# Patient Record
Sex: Male | Born: 1985
Health system: Southern US, Community
[De-identification: ages and names within clinical notes are randomized; demographics above are authoritative.]

## PROBLEM LIST (undated history)

## (undated) DIAGNOSIS — J45909 Unspecified asthma, uncomplicated: Secondary | ICD-10-CM

## (undated) DIAGNOSIS — E785 Hyperlipidemia, unspecified: Secondary | ICD-10-CM

## (undated) DIAGNOSIS — E079 Disorder of thyroid, unspecified: Secondary | ICD-10-CM

## (undated) HISTORY — DX: Unspecified asthma, uncomplicated: J45.909

## (undated) HISTORY — PX: NO PAST SURGERIES: SHX2092

## (undated) HISTORY — DX: Hyperlipidemia, unspecified: E78.5

---

## 2010-07-11 ENCOUNTER — Ambulatory Visit: Payer: Self-pay | Admitting: Internal Medicine

## 2010-07-18 ENCOUNTER — Other Ambulatory Visit: Payer: Self-pay | Admitting: Surgery

## 2010-07-20 ENCOUNTER — Ambulatory Visit: Payer: Self-pay | Admitting: Surgery

## 2010-12-27 ENCOUNTER — Ambulatory Visit: Payer: Self-pay

## 2012-12-11 ENCOUNTER — Ambulatory Visit: Payer: Self-pay | Admitting: Physician Assistant

## 2012-12-11 LAB — BASIC METABOLIC PANEL
Anion Gap: 7 (ref 7–16)
Calcium, Total: 9.2 mg/dL (ref 8.5–10.1)
Chloride: 100 mmol/L (ref 98–107)
Co2: 31 mmol/L (ref 21–32)
Creatinine: 1.09 mg/dL (ref 0.60–1.30)
EGFR (African American): >60
EGFR (Non-African Amer.): >60
Glucose: 97 mg/dL (ref 65–99)
Potassium: 3.9 mmol/L (ref 3.5–5.1)

## 2013-07-03 ENCOUNTER — Ambulatory Visit: Payer: Self-pay | Admitting: Emergency Medicine

## 2013-10-01 ENCOUNTER — Other Ambulatory Visit: Payer: Self-pay | Admitting: Family

## 2013-10-01 DIAGNOSIS — R1032 Left lower quadrant pain: Secondary | ICD-10-CM

## 2013-10-02 ENCOUNTER — Ambulatory Visit
Admission: RE | Admit: 2013-10-02 | Discharge: 2013-10-02 | Disposition: A | Discharge status: Home / Self Care | Payer: 59 | Source: Ambulatory Visit | Attending: Family | Admitting: Family

## 2013-10-02 DIAGNOSIS — R1032 Left lower quadrant pain: Secondary | ICD-10-CM

## 2013-10-02 MED ORDER — IOHEXOL 300 MG/ML  SOLN
100.0000 mL | Freq: Once | INTRAMUSCULAR | Status: AC | PRN
  Administered 2013-10-02: 100 mL via INTRAVENOUS

## 2014-08-13 ENCOUNTER — Other Ambulatory Visit: Payer: Self-pay | Admitting: Nurse Practitioner

## 2014-08-13 ENCOUNTER — Other Ambulatory Visit: Payer: Self-pay | Admitting: Internal Medicine

## 2014-08-13 ENCOUNTER — Ambulatory Visit
Admission: RE | Admit: 2014-08-13 | Discharge: 2014-08-13 | Disposition: A | Discharge status: Home / Self Care | Payer: 59 | Source: Ambulatory Visit | Attending: Internal Medicine | Admitting: Internal Medicine

## 2014-08-13 DIAGNOSIS — R059 Cough, unspecified: Secondary | ICD-10-CM

## 2014-08-13 DIAGNOSIS — R509 Fever, unspecified: Secondary | ICD-10-CM

## 2014-08-13 DIAGNOSIS — R05 Cough: Secondary | ICD-10-CM

## 2014-12-09 ENCOUNTER — Ambulatory Visit: Payer: Self-pay | Admitting: Allergy and Immunology

## 2015-01-25 ENCOUNTER — Encounter: Payer: Self-pay | Admitting: Emergency Medicine

## 2015-01-25 ENCOUNTER — Ambulatory Visit
Admission: EM | Admit: 2015-01-25 | Discharge: 2015-01-25 | Disposition: A | Discharge status: Home / Self Care | Payer: 59 | Attending: Family Medicine | Admitting: Family Medicine

## 2015-01-25 DIAGNOSIS — L259 Unspecified contact dermatitis, unspecified cause: Secondary | ICD-10-CM | POA: Diagnosis not present

## 2015-01-25 MED ORDER — METHYLPREDNISOLONE 4 MG PO TBPK: ORAL_TABLET | ORAL | Status: DC

## 2015-01-25 MED ORDER — TRIAMCINOLONE ACETONIDE 0.1 % EX CREA: 1.0000 "application " | TOPICAL_CREAM | Freq: Two times a day (BID) | CUTANEOUS | Status: DC

## 2015-01-25 NOTE — ED Provider Notes (Signed)
CSN: [id number]     Arrival date & time 01/25/15  1226 History   First MD Initiated Contact with Patient 01/25/15 1410     Chief Complaint  Patient presents with  . Rash   (Consider location/radiation/quality/duration/timing/severity/associated sxs/prior Treatment) HPI   29 year old male presents with a one-week history of an itchy rash on the extensor surfaces of his arms. He said that occurred after they move some furniture from a warehouse storage area. The fracture was his home. His wife does not have the same symptoms. He does have a extensive history of a previous environmental and food allergies. He has never had this kind of a rash in the past however he states his wife had to buy recently changed detergents but after he started developing the symptoms she had switched back to their original. Despite all this he has been using Benadryl cream but without much success. He states he is trying to avoid itching the area but it is extremely bothersome to him.    History reviewed. No pertinent past medical history. History reviewed. No pertinent past surgical history. History reviewed. No pertinent family history. Social History  Substance Use Topics  . Smoking status: Never Smoker   . Smokeless tobacco: None  . Alcohol Use: Yes    Review of Systems  Constitutional: Negative for fever, chills, diaphoresis and fatigue.  Skin: Positive for rash.  All other systems reviewed and are negative.   Allergies  Review of patient's allergies indicates no known allergies.  Home Medications   Prior to Admission medications   Medication Sig Start Date End Date Taking? Authorizing Provider  cetirizine (ZYRTEC) 10 MG tablet Take 10 mg by mouth daily.   Yes Historical Provider, MD  methylPREDNISolone (MEDROL DOSEPAK) 4 MG TBPK tablet Take per package instructions 01/25/15   Lutricia FeilWilliam P Belissa Kooy, PA-C  triamcinolone cream (KENALOG) 0.1 % Apply 1 application topically 2 (two) times daily. 01/25/15    Lutricia FeilWilliam P Tysean Vand[place], PA-C   Meds Ordered and Administered this Visit  Medications - No data to display  BP 127/87 mmHg  Pulse 82  Temp(Src) 96.4 F (35.8 C) (Tympanic)  Resp 16  Ht 6' (1.829 m)  Wt 187 lb (84.823 kg)  BMI 25.36 kg/m2  SpO2 98% No data found.   Physical Exam  Constitutional: He is oriented to person, place, and time. He appears well-developed and well-nourished.  HENT:  Head: Normocephalic and atraumatic.  Eyes: Pupils are equal, round, and reactive to light.  Neck: Neck supple.  Musculoskeletal: Normal range of motion. He exhibits no edema or tenderness.  Neurological: He is alert and oriented to person, place, and time.  Skin: Rash noted.  Examination of his upper torso shows extensor surface erythema splotches of erythematous macular papular with a few vesicles present extending into the axilla but not involving his hands below the wrists. Is no flexor surface involvement. He has no other involvement of his torso neck face legs groin or feet. No excoriations are present.  Psychiatric: He has a normal mood and affect. His behavior is normal. Judgment and thought content normal.  Nursing note and vitals reviewed.   ED Course  Procedures (including critical care time)  Labs Review Labs Reviewed - No data to display  Imaging Review No results found.   Visual Acuity Review  Right Eye Distance:   Left Eye Distance:   Bilateral Distance:    Right Eye Near:   Left Eye Near:    Bilateral Near:  MDM   1. Contact dermatitis and eczema    Discharge Medication List as of 01/25/2015  2:37 PM    START taking these medications   Details  methylPREDNISolone (MEDROL DOSEPAK) 4 MG TBPK tablet Take per package instructions, Normal    triamcinolone cream (KENALOG) 0.1 % Apply 1 application topically 2 (two) times daily., Starting 01/25/2015, Until Discontinued, Normal      Plan: 1Ddiagnosis reviewed with patient 2. rx as per orders; risks,  benefits, potential side effects reviewed with patient 3. Recommend supportive treatment with Use emollients after showering.   4. F/u  With derm if not improving     Lutricia Feil, PA-C 01/26/15 1710

## 2015-01-25 NOTE — Discharge Instructions (Signed)

## 2015-01-25 NOTE — ED Notes (Signed)
Patient c/o itchy rash on his arms for a week.

## 2015-05-24 ENCOUNTER — Ambulatory Visit
Admission: EM | Admit: 2015-05-24 | Discharge: 2015-05-24 | Disposition: A | Discharge status: Home / Self Care | Payer: 59 | Attending: Emergency Medicine | Admitting: Emergency Medicine

## 2015-05-24 DIAGNOSIS — J101 Influenza due to other identified influenza virus with other respiratory manifestations: Secondary | ICD-10-CM

## 2015-05-24 LAB — RAPID INFLUENZA A&B ANTIGENS
Influenza A (ARMC): NEGATIVE
Influenza B (ARMC): POSITIVE — AB

## 2015-05-24 MED ORDER — VALACYCLOVIR HCL 1 G PO TABS: 2000.0000 mg | ORAL_TABLET | Freq: Two times a day (BID) | ORAL | Status: DC

## 2015-05-24 MED ORDER — IBUPROFEN 800 MG PO TABS: 800.0000 mg | ORAL_TABLET | Freq: Three times a day (TID) | ORAL | Status: DC

## 2015-05-24 MED ORDER — OSELTAMIVIR PHOSPHATE 75 MG PO CAPS: 75.0000 mg | ORAL_CAPSULE | Freq: Two times a day (BID) | ORAL | Status: DC

## 2015-05-24 NOTE — Discharge Instructions (Signed)
Do not fill the Valtrex until you get the results of your culture. This will be available in several days. You can call here to get your results. Start ibuprofen with 1 g of Tylenol 3 times a day. Drink plenty of extra fluids. Use the cough syrup as needed for coughing. Make sure you finish the Tamiflu.

## 2015-05-24 NOTE — ED Provider Notes (Signed)
HPI  SUBJECTIVE:  Laurelyn SickleKrishna P Hyder is a 30 y.o. male who presents with fevers Tmax 100.2, headaches, body aches, weakness, chills, nasal congestion, rhinorrhea, sore throat, postnasal drip, cough for the past 2 days. He reports mild photophobia. No wheezing, chest pain, shortness of breath. He is able to sleep at night. No ear pain, sinus pain or pressure. No nausea, vomiting, abdominal pain. No urinary complaints. No neck stiffness, rash. He has been taking TheraFlu and ibuprofen, Hycodan cough syrup with some improvement. No aggravating factors. He also notes some blisters starting on his left lower lip today. He denies any burning or paresthesias. He has past medical history hypercholesterolemia, no history of asthma, eczema, COPD. He is a smoker. No history of diabetes, hypertension, cold sores, herpes. PMD: Dr. [place]lyne [place]Sanders in [place], is looking for primary care physician here.   History reviewed. No pertinent past medical history.  Past Surgical History  Procedure Laterality Date  . No past surgeries      History reviewed. No pertinent family history.  Social History  Substance Use Topics  . Smoking status: Never Smoker   . Smokeless tobacco: None  . Alcohol Use: No    No current facility-administered medications for this encounter.  Current outpatient prescriptions:  .  ibuprofen (ADVIL,MOTRIN) 800 MG tablet, Take 1 tablet (800 mg total) by mouth 3 (three) times daily., Disp: 30 tablet, Rfl: 0 .  oseltamivir (TAMIFLU) 75 MG capsule, Take 1 capsule (75 mg total) by mouth 2 (two) times daily. X 5 days, Disp: 10 capsule, Rfl: 0 .  valACYclovir (VALTREX) 1000 MG tablet, Take 2 tablets (2,000 mg total) by mouth 2 (two) times daily. X 1 day., Disp: 20 tablet, Rfl: 0 .  [DISCONTINUED] cetirizine (ZYRTEC) 10 MG tablet, Take 10 mg by mouth daily., Disp: , Rfl:   No Known Allergies   ROS  As noted in HPI.   Physical Exam  BP 121/79 mmHg  Pulse 101  Temp(Src) 100.5 F (38.1  C) (Tympanic)  Resp 16  Ht 6' (1.829 m)  Wt 181 lb (82.101 kg)  BMI 24.54 kg/m2  SpO2 99%  Constitutional: Well developed, well nourished, no acute distress Eyes: PERRL, EOMI, conjunctiva normal bilaterally HENT: Normocephalic, atraumatic,mucus membranes moist. TMs normal bilaterally. Positive erythematous swollen turbinates with nasal congestion. No sinus tenderness. Slightly erythematous oropharynx, uvula midline, tonsils normal. + grouped vesicles on anterior lower lip. Skin intact. No other oral ulcers. Neck: Positive cervical lymphadenopathy. No meningismus.  respiratory:  Clear to auscultation bilaterally, no rales, no wheezing, no rhonchi Cardiovascular: Regular tachycardia, no murmurs, no gallops, no rubs GI: Soft, nondistended, normal bowel sounds, nontender, no rebound, no guarding Back: no CVAT skin: No rash, skin intact Musculoskeletal: No edema, no tenderness, no deformities Neurologic: Alert & oriented x 3, CN II-XII grossly intact, no motor deficits, sensation grossly intact Psychiatric: Speech and behavior appropriate   ED Course   Medications - No data to display  Orders Placed This Encounter  Procedures  . Rapid Influenza A&B Antigens ([facility] only)    Standing Status: Standing     Number of Occurrences: 1     Standing Expiration Date:   . Hsv Culture And Typing    Standing Status: Standing     Number of Occurrences: 1     Standing Expiration Date:   . Droplet precaution    Standing Status: Standing     Number of Occurrences: 1     Standing Expiration Date:    Results for orders placed  or performed during the hospital encounter of 05/24/15 (from the past 24 hour(s))  Rapid Influenza A&B Antigens ([facility] only)     Status: Abnormal   Collection Time: 05/24/15  8:54 PM  Result Value Ref Range   Influenza A ([facility]) NEGATIVE NEGATIVE   Influenza B ([facility]) POSITIVE (A) NEGATIVE   No results found.  ED Clinical Impression  Influenza B   ED  Assessment/Plan  She is influenza B-positive. At home with ibuprofen and Tamiflu. He has plenty of cough syrup at home. Could also be a primary HSV infection, so sent off herpes culture after unroofing a vesicle on his lip. We'll send home with Valtrex for patient to fill if the HSV culture is positive. He is to call here for results in several days. He is to wait and fill it. Will provide primary care referral to [facility] primary care.  Discussed labs, MDM, plan and followup with patient. Discussed sn/sx that should prompt return to the ED. Patient agrees with plan.   *This clinic note was created using Dragon dictation software. Therefore, there may be occasional mistakes despite careful proofreading.  ?   Domenick Gong, MD 05/24/15 2152

## 2015-05-24 NOTE — ED Notes (Signed)
Patient complains of fever, cough, cold chills, loss of appetite and weakness with headaches. Patient states that symptoms started on Friday.

## 2015-05-28 LAB — HSV CULTURE AND TYPING

## 2015-05-29 ENCOUNTER — Telehealth: Payer: Self-pay | Admitting: Emergency Medicine

## 2015-05-29 NOTE — ED Notes (Signed)
Patient notified that his HSV culture came back positive.  Patient states that he has not started his Valacyclovir.  Patient was instructed to go ahead and start his Valacyclovir as directed and to follow-up with his PCP if his symptoms persist or worsen.  Patient verbalized understanding.

## 2015-10-05 ENCOUNTER — Encounter: Payer: Self-pay | Admitting: *Deleted

## 2015-10-05 ENCOUNTER — Ambulatory Visit
Admission: EM | Admit: 2015-10-05 | Discharge: 2015-10-05 | Disposition: A | Discharge status: Home / Self Care | Payer: 59 | Attending: Family Medicine | Admitting: Family Medicine

## 2015-10-05 DIAGNOSIS — B349 Viral infection, unspecified: Secondary | ICD-10-CM

## 2015-10-05 LAB — URINALYSIS COMPLETE WITH MICROSCOPIC (ARMC ONLY)
Bacteria, UA: NONE SEEN
Bilirubin Urine: NEGATIVE
Glucose, UA: NEGATIVE mg/dL
Hgb urine dipstick: NEGATIVE
Ketones, ur: NEGATIVE mg/dL
Leukocytes, UA: NEGATIVE
Nitrite: NEGATIVE
Protein, ur: NEGATIVE mg/dL
Specific Gravity, Urine: 1.01 (ref 1.005–1.030)
Squamous Epithelial / LPF: NONE SEEN
pH: 5.5 (ref 5.0–8.0)

## 2015-10-05 LAB — RAPID STREP SCREEN (MED CTR MEBANE ONLY): Streptococcus, Group A Screen (Direct): NEGATIVE

## 2015-10-05 MED ORDER — ACETAMINOPHEN 500 MG PO TABS
1000.0000 mg | ORAL_TABLET | Freq: Once | ORAL | Status: AC
  Administered 2015-10-05: 1000 mg via ORAL

## 2015-10-05 NOTE — ED Triage Notes (Signed)
While at work today pt became dizzy and a headache. Went to see plant nurse and sent home with fever. Here tonight with body aches, fever, sore throat, headache, and dizziness.

## 2015-10-05 NOTE — ED Provider Notes (Signed)
CSN: [id number]     Arrival date & time 10/05/15  1921 History   First MD Initiated Contact with Patient 10/05/15 2018     Chief Complaint  Patient presents with  . Dizziness  . Fever  . Generalized Body Aches  . Headache   (Consider location/radiation/quality/duration/timing/severity/associated sxs/prior Treatment) HPI  This a 30 year old male who presents with a sudden onset of dizziness headache and bodyaches fever and sore throat. He states this started this morning at work and the work nurse took his vital signs with a high fever and high pulse rate. He went home and took a Tamiflu thinking he may have had the flu. He states that after taking one Tamiflu he did not feel any better so decided to come here. His wife just returned from a four-day trip to [facility] he states that he's been going in and out of hot and cold buildings. Temperature presently is 102.4 pulse of 126 blood pressure 122/71 respirations 16 O2 sat on room air 100%. He denies any nausea vomiting has had no stomach pain. He's had no urinary tract symptoms other than it being very dark yellow. He denies any cough or sputum production.   History reviewed. No pertinent past medical history. Past Surgical History:  Procedure Laterality Date  . NO PAST SURGERIES     History reviewed. No pertinent family history. Social History  Substance Use Topics  . Smoking status: Never Smoker  . Smokeless tobacco: Never Used  . Alcohol use 0.0 oz/week    Review of Systems  Constitutional: Positive for activity change, appetite change, chills, fatigue and fever.  HENT: Positive for congestion and sore throat.   Respiratory: Negative for cough, choking and shortness of breath.   All other systems reviewed and are negative.   Allergies  Review of patient's allergies indicates no known allergies.  Home Medications   Prior to Admission medications   Medication Sig Start Date End Date Taking? Authorizing Provider   ibuprofen (ADVIL,MOTRIN) 800 MG tablet Take 1 tablet (800 mg total) by mouth 3 (three) times daily. 05/24/15   Domenick Gong, MD  oseltamivir (TAMIFLU) 75 MG capsule Take 1 capsule (75 mg total) by mouth 2 (two) times daily. X 5 days 05/24/15   Domenick Gong, MD  valACYclovir (VALTREX) 1000 MG tablet Take 2 tablets (2,000 mg total) by mouth 2 (two) times daily. X 1 day. 05/24/15   Domenick Gong, MD   Meds Ordered and Administered this Visit   Medications  acetaminophen (TYLENOL) tablet 1,000 mg (1,000 mg Oral Given 10/05/15 1958)    BP 122/71 (BP Location: Left Arm)   Pulse (!) 126   Temp (!) 102.4 F (39.1 C) (Oral)   Resp 16   Ht 6' (1.829 m)   Wt 180 lb (81.6 kg)   SpO2 100%   BMI 24.41 kg/m  No data found.   Physical Exam  Constitutional: He is oriented to person, place, and time. He appears well-developed and well-nourished. No distress.  HENT:  Head: Normocephalic and atraumatic.  Right Ear: External ear normal.  Left Ear: External ear normal.  Nose: Nose normal.  Mouth/Throat: Oropharynx is clear and moist. No oropharyngeal exudate.  Eyes: EOM are normal. Pupils are equal, round, and reactive to light. Right eye exhibits no discharge. Left eye exhibits no discharge.  Neck: Normal range of motion. Neck supple.  Pulmonary/Chest: Effort normal and breath sounds normal. No respiratory distress. He has no wheezes. He has no rales.  Abdominal: Soft. Bowel  sounds are normal. He exhibits no distension. There is no tenderness. There is no rebound and no guarding.  Musculoskeletal: Normal range of motion. He exhibits no edema, tenderness or deformity.  Lymphadenopathy:    He has no cervical adenopathy.  Neurological: He is alert and oriented to person, place, and time.  Skin: Skin is warm and dry. He is not diaphoretic.  Psychiatric: He has a normal mood and affect. His behavior is normal. Judgment and thought content normal.  Nursing note and vitals reviewed.   Urgent  Care Course   Clinical Course    Procedures (including critical care time)  Labs Review Labs Reviewed  RAPID STREP SCREEN (NOT AT [facility])  CULTURE, GROUP A STREP ([facility])  URINALYSIS COMPLETEWITH MICROSCOPIC ([facility] ONLY)    Imaging Review No results found.   Visual Acuity Review  Right Eye Distance:   Left Eye Distance:   Bilateral Distance:    Right Eye Near:   Left Eye Near:    Bilateral Near:         MDM   1. Viral illness    Discharge Medication List as of 10/05/2015  9:00 PM    Plan: 1. Test/x-ray results and diagnosis reviewed with patient 2. rx as per orders; risks, benefits, potential side effects reviewed with patient 3. Recommend supportive treatment with rest and fever control with Tylenol or Motrin. He needs to keep his fluids intake adequate to keep his urine clear. He has no identifiable source today but may develop over the next couple of days. Should follow-up with his PCP if he is not improving. At the present time we'll be treating him symptomatically and if it's a viral illness will have to run its course. The CNS of the throat swab will be available in 48 hours  4. F/u prn if symptoms worsen or don't improve     Lutricia FeilWilliam P Roemer, PA-C 10/05/15 2107

## 2015-10-08 LAB — CULTURE, GROUP A STREP (THRC)

## 2016-03-01 ENCOUNTER — Ambulatory Visit (INDEPENDENT_AMBULATORY_CARE_PROVIDER_SITE_OTHER): Payer: Self-pay | Admitting: Internal Medicine

## 2016-03-01 ENCOUNTER — Encounter: Payer: Self-pay | Admitting: Internal Medicine

## 2016-03-01 VITALS — BP 120/80 | HR 86 | Temp 98.0°F | Ht 72.0 in | Wt 187.0 lb

## 2016-03-01 DIAGNOSIS — K529 Noninfective gastroenteritis and colitis, unspecified: Secondary | ICD-10-CM

## 2016-03-01 DIAGNOSIS — E782 Mixed hyperlipidemia: Secondary | ICD-10-CM | POA: Insufficient documentation

## 2016-03-01 NOTE — Progress Notes (Addendum)
    Date:  03/01/2016   Name:  Harold Hill   DOB:  03/23/1985   MRN:  [id number]   Chief Complaint: Establish Care Abdominal Cramping  This is a new problem. The current episode started in the past 7 days. The problem occurs 2 to 4 times per day. The problem has been gradually improving. The pain is located in the LLQ. The pain is mild. The quality of the pain is colicky and cramping. Pertinent negatives include no arthralgias, diarrhea, fever, headaches, nausea or vomiting. The pain is relieved by liquids.  Hyperlipidemia  This is a chronic problem. Condition status: mild per patient report. Pertinent negatives include no chest pain or shortness of breath. Current antihyperlipidemic treatment includes diet change and exercise (took medication in the past but stopped after starting exercise and improving diet).  Today he was able to eat breakfast and feels much better.  He has no risk for CAD.    Review of Systems  Constitutional: Positive for fatigue. Negative for chills, fever and unexpected weight change.  Eyes: Negative for visual disturbance.  Respiratory: Negative for cough, chest tightness, shortness of breath and wheezing.   Cardiovascular: Negative for chest pain, palpitations and leg swelling.  Gastrointestinal: Positive for abdominal pain (LLQ mildly tender). Negative for diarrhea, nausea and vomiting.  Musculoskeletal: Negative for arthralgias.  Neurological: Negative for dizziness and headaches.  Psychiatric/Behavioral: Negative for sleep disturbance. The patient is not nervous/anxious.     There are no active problems to display for this patient.   Prior to Admission medications   Not on File    No Known Allergies  Past Surgical History:  Procedure Laterality Date  . NO PAST SURGERIES      Social History  Substance Use Topics  . Smoking status: Never Smoker  . Smokeless tobacco: Never Used  . Alcohol use 0.0 oz/week     Medication list has been reviewed  and updated.   Physical Exam  Constitutional: He is oriented to person, place, and time. He appears well-developed. No distress.  HENT:  Head: Normocephalic and atraumatic.  Neck: Normal range of motion. Neck supple. No thyromegaly present.  Cardiovascular: Normal rate and normal heart sounds.   Pulmonary/Chest: Effort normal and breath sounds normal. No respiratory distress. He has no wheezes.  Abdominal: Soft. Bowel sounds are normal. He exhibits no distension. There is tenderness.  Musculoskeletal: Normal range of motion. He exhibits tenderness (right ankle from old fracture). He exhibits no edema.  Neurological: He is alert and oriented to person, place, and time.  Skin: Skin is warm and dry. No rash noted.  Psychiatric: He has a normal mood and affect. His behavior is normal. Thought content normal. Cognition and memory are normal.  Nursing note and vitals reviewed.   BP 120/80   Pulse 86   Temp 98 F (36.7 C)   Ht 6' (1.829 m)   Wt 187 lb (84.8 kg)   SpO2 97%   BMI 25.36 kg/m   Assessment and Plan: 1. Mixed hyperlipidemia Continue diet and exercise Return in 6 months for CPX and labs  2. Gastroenteritis Resolving - advance diet as tolerated   Bari EdwardLaura Blaike Vickers, MD [facility]  03/01/2016

## 2016-03-20 ENCOUNTER — Telehealth: Payer: Self-pay

## 2016-03-20 NOTE — Telephone Encounter (Signed)
Advised needs to be seen before we can send in any inhalers. Will schedule when returns to town.

## 2016-09-08 ENCOUNTER — Encounter: Payer: Self-pay | Admitting: Internal Medicine

## 2016-09-08 ENCOUNTER — Ambulatory Visit (INDEPENDENT_AMBULATORY_CARE_PROVIDER_SITE_OTHER): Payer: 59 | Admitting: Internal Medicine

## 2016-09-08 VITALS — BP 116/76 | HR 88 | Ht 72.0 in | Wt 195.6 lb

## 2016-09-08 DIAGNOSIS — Z0000 Encounter for general adult medical examination without abnormal findings: Secondary | ICD-10-CM

## 2016-09-08 DIAGNOSIS — R5383 Other fatigue: Secondary | ICD-10-CM | POA: Diagnosis not present

## 2016-09-08 DIAGNOSIS — Z9109 Other allergy status, other than to drugs and biological substances: Secondary | ICD-10-CM

## 2016-09-08 DIAGNOSIS — E782 Mixed hyperlipidemia: Secondary | ICD-10-CM

## 2016-09-08 LAB — POCT URINALYSIS DIPSTICK
Bilirubin, UA: NEGATIVE
Blood, UA: NEGATIVE
GLUCOSE UA: NEGATIVE
KETONES UA: NEGATIVE
LEUKOCYTES UA: NEGATIVE
Nitrite, UA: NEGATIVE
PROTEIN UA: NEGATIVE
Spec Grav, UA: 1.025 (ref 1.010–1.025)
Urobilinogen, UA: 0.2 E.U./dL
pH, UA: 5 (ref 5.0–8.0)

## 2016-09-08 MED ORDER — ALBUTEROL SULFATE HFA 108 (90 BASE) MCG/ACT IN AERS: 2.0000 | INHALATION_SPRAY | Freq: Four times a day (QID) | RESPIRATORY_TRACT | 2 refills | Status: DC | PRN

## 2016-09-08 NOTE — Progress Notes (Signed)
Date:  09/08/2016   Name:  Taber Sweetser [facility]   DOB:  January 09, 1986   MRN:  [id number]   Chief Complaint: Annual Exam Adyn Serna Denz is a 31 y.o. male who presents today for his Complete Annual Exam. He feels fairly well. He reports exercising rarely. He reports he is sleeping fairly well. He has been more fatigued lately - works hard at work on his feet all day but very inactive once he gets home.  Hyperlipidemia  Pertinent negatives include no chest pain or shortness of breath.   Allergies - perennial with mainly chest tightness.  He has some nasal sx as well.  Has a proair inhaler to use as needed but needs refill.   Review of Systems  Constitutional: Positive for fatigue. Negative for chills, fever and unexpected weight change.  HENT: Negative for congestion and trouble swallowing.   Eyes: Negative for visual disturbance.  Respiratory: Negative for chest tightness, shortness of breath and wheezing.   Cardiovascular: Negative for chest pain, palpitations and leg swelling.  Gastrointestinal: Negative for abdominal pain, constipation and diarrhea.  Endocrine: Negative for polydipsia.  Genitourinary: Negative for difficulty urinating, frequency and hematuria.  Musculoskeletal: Negative for arthralgias, back pain and gait problem.  Allergic/Immunologic: Positive for environmental allergies.  Neurological: Negative for dizziness and headaches.  Hematological: Negative for adenopathy.  Psychiatric/Behavioral: Negative for dysphoric mood and sleep disturbance.    Patient Active Problem List   Diagnosis Date Noted  . Mixed hyperlipidemia 03/01/2016    Prior to Admission medications   Medication Sig Start Date End Date Taking? Authorizing Provider  cetirizine (ZYRTEC) 10 MG tablet Take 10 mg by mouth as needed for allergies.   Yes [provider]    No Known Allergies  Past Surgical History:  Procedure Laterality Date  . NO PAST SURGERIES      Social History    Substance Use Topics  . Smoking status: Never Smoker  . Smokeless tobacco: Never Used  . Alcohol use 0.0 oz/week   Depression screen [facility] 2/9 03/01/2016  Decreased Interest 0  Down, Depressed, Hopeless 0  PHQ - 2 Score 0     Medication list has been reviewed and updated.   Physical Exam  Constitutional: He is oriented to person, place, and time. He appears well-developed and well-nourished.  HENT:  Head: Normocephalic.  Right Ear: Tympanic membrane, external ear and ear canal normal.  Left Ear: Tympanic membrane, external ear and ear canal normal.  Nose: Nose normal.  Mouth/Throat: Uvula is midline and oropharynx is clear and moist.  Eyes: Pupils are equal, round, and reactive to light. Conjunctivae and EOM are normal.  Neck: Normal range of motion. Neck supple. Carotid bruit is not present. No thyromegaly present.  Cardiovascular: Normal rate, regular rhythm, normal heart sounds and intact distal pulses.   Pulmonary/Chest: Effort normal and breath sounds normal. He has no wheezes. Right breast exhibits no mass. Left breast exhibits no mass.  Abdominal: Soft. Normal appearance and bowel sounds are normal. There is no hepatosplenomegaly. There is no tenderness.  Musculoskeletal: Normal range of motion. He exhibits no edema or tenderness.  Lymphadenopathy:    He has no cervical adenopathy.  Neurological: He is alert and oriented to person, place, and time. He has normal reflexes.  Skin: Skin is warm, dry and intact.  Psychiatric: He has a normal mood and affect. His speech is normal and behavior is normal. Judgment and thought content normal.  Nursing note and vitals reviewed.  BP 116/76   Pulse 88   Ht 6' (1.829 m)   Wt 195 lb 9.6 oz (88.7 kg)   SpO2 99%   BMI 26.53 kg/m   Assessment and Plan: 1. Annual physical exam Recommend beginning regular exercise   2. Mixed hyperlipidemia Will advise on medication if needed  3. Fatigue, unspecified type Likely due to job  stress and lack of exercise Discussed exercise and adequate sleep  4. Multiple environmental allergies Continue zyrtec and albuterol MDI as needed   Meds ordered this encounter  Medications  . albuterol (PROVENTIL HFA;VENTOLIN HFA) 108 (90 Base) MCG/ACT inhaler    Sig: Inhale 2 puffs into the lungs every 6 (six) hours as needed for wheezing or shortness of breath.    Dispense:  18 g    Refill:  2    Bari EdwardLaura Kaushal Vannice, MD [facility]  09/08/2016

## 2016-09-08 NOTE — Patient Instructions (Signed)

## 2016-09-09 LAB — CBC WITH DIFFERENTIAL/PLATELET
BASOS: 0 %
Basophils Absolute: 0 10*3/uL (ref 0.0–0.2)
EOS (ABSOLUTE): 0.2 10*3/uL (ref 0.0–0.4)
Eos: 3 %
HEMOGLOBIN: 14.6 g/dL (ref 13.0–17.7)
Hematocrit: 43 % (ref 37.5–51.0)
IMMATURE GRANS (ABS): 0 10*3/uL (ref 0.0–0.1)
Immature Granulocytes: 0 %
LYMPHS ABS: 2 10*3/uL (ref 0.7–3.1)
LYMPHS: 35 %
MCH: 26.9 pg (ref 26.6–33.0)
MCHC: 34 g/dL (ref 31.5–35.7)
MCV: 79 fL (ref 79–97)
Monocytes Absolute: 0.3 10*3/uL (ref 0.1–0.9)
Monocytes: 6 %
NEUTROS ABS: 3.2 10*3/uL (ref 1.4–7.0)
Neutrophils: 56 %
Platelets: 235 10*3/uL (ref 150–379)
RBC: 5.43 x10E6/uL (ref 4.14–5.80)
RDW: 15.3 % (ref 12.3–15.4)
WBC: 5.8 10*3/uL (ref 3.4–10.8)

## 2016-09-09 LAB — COMPREHENSIVE METABOLIC PANEL
A/G RATIO: 1.5 (ref 1.2–2.2)
ALBUMIN: 4.6 g/dL (ref 3.5–5.5)
ALK PHOS: 69 IU/L (ref 39–117)
ALT: 25 IU/L (ref 0–44)
AST: 22 IU/L (ref 0–40)
BILIRUBIN TOTAL: 0.5 mg/dL (ref 0.0–1.2)
BUN / CREAT RATIO: 15 (ref 9–20)
BUN: 14 mg/dL (ref 6–20)
CHLORIDE: 100 mmol/L (ref 96–106)
CO2: 23 mmol/L (ref 20–29)
Calcium: 9.5 mg/dL (ref 8.7–10.2)
Creatinine, Ser: 0.96 mg/dL (ref 0.76–1.27)
GFR calc non Af Amer: 104 mL/min/{1.73_m2} (ref >59)
GFR, EST AFRICAN AMERICAN: 120 mL/min/{1.73_m2} (ref >59)
GLOBULIN, TOTAL: 3.1 g/dL (ref 1.5–4.5)
Glucose: 106 mg/dL — ABNORMAL HIGH (ref 65–99)
POTASSIUM: 4.2 mmol/L (ref 3.5–5.2)
SODIUM: 139 mmol/L (ref 134–144)
TOTAL PROTEIN: 7.7 g/dL (ref 6.0–8.5)

## 2016-09-09 LAB — LIPID PANEL
Chol/HDL Ratio: 7 ratio — ABNORMAL HIGH (ref 0.0–5.0)
Cholesterol, Total: 246 mg/dL — ABNORMAL HIGH (ref 100–199)
HDL: 35 mg/dL — ABNORMAL LOW (ref >39)
LDL Calculated: 164 mg/dL — ABNORMAL HIGH (ref 0–99)
Triglycerides: 233 mg/dL — ABNORMAL HIGH (ref 0–149)
VLDL CHOLESTEROL CAL: 47 mg/dL — AB (ref 5–40)

## 2016-09-09 LAB — TSH: TSH: 4.16 u[IU]/mL (ref 0.450–4.500)

## 2016-09-12 ENCOUNTER — Other Ambulatory Visit: Payer: Self-pay | Admitting: Internal Medicine

## 2016-09-12 ENCOUNTER — Telehealth: Payer: Self-pay

## 2016-09-12 DIAGNOSIS — E782 Mixed hyperlipidemia: Secondary | ICD-10-CM

## 2016-09-12 MED ORDER — ATORVASTATIN CALCIUM 10 MG PO TABS: 10.0000 mg | ORAL_TABLET | Freq: Every day | ORAL | 1 refills | Status: DC

## 2016-09-12 NOTE — Telephone Encounter (Signed)
Spoke to pt about cholesterol being high- pt agrees to take medication when sent in. Wants sent to [facility] in [place]. Appt scheduled for 6 months.

## 2017-03-08 ENCOUNTER — Other Ambulatory Visit: Payer: Self-pay | Admitting: Internal Medicine

## 2017-03-15 ENCOUNTER — Ambulatory Visit (INDEPENDENT_AMBULATORY_CARE_PROVIDER_SITE_OTHER): Payer: 59 | Admitting: Internal Medicine

## 2017-03-15 ENCOUNTER — Encounter: Payer: Self-pay | Admitting: Internal Medicine

## 2017-03-15 VITALS — BP 122/84 | HR 86 | Ht 72.0 in | Wt 192.0 lb

## 2017-03-15 DIAGNOSIS — E782 Mixed hyperlipidemia: Secondary | ICD-10-CM | POA: Diagnosis not present

## 2017-03-15 NOTE — Progress Notes (Signed)
Date:  03/15/2017   Name:  Harold Hill   DOB:  12-10-1985   MRN:  [id number]   Chief Complaint: Hyperlipidemia     Hyperlipidemia  This is a chronic problem. Pertinent negatives include no chest pain, myalgias or shortness of breath. Current antihyperlipidemic treatment includes statins (statin started last visit in July).   His father committed suicide several months ago and he has struggled for a while.  He lost some weight but is gaining it back.  Grief symptoms are slowly improving. On a happier note, his wife is [redacted] weeks pregnant after IVF and doing well.  Lab Results  Component Value Date   CHOL 246 (H) 09/08/2016   HDL 35 (L) 09/08/2016   LDLCALC 164 (H) 09/08/2016   TRIG 233 (H) 09/08/2016   CHOLHDL 7.0 (H) 09/08/2016      Review of Systems  Constitutional: Negative for chills, fatigue and unexpected weight change.  Respiratory: Negative for chest tightness and shortness of breath.   Cardiovascular: Negative for chest pain and palpitations.  Gastrointestinal: Negative for abdominal distention, abdominal pain, diarrhea and vomiting.  Musculoskeletal: Negative for arthralgias, joint swelling and myalgias.  Allergic/Immunologic: Positive for environmental allergies.  Neurological: Negative for dizziness and headaches.  Psychiatric/Behavioral: Negative for sleep disturbance.    Patient Active Problem List   Diagnosis Date Noted  . Multiple environmental allergies 09/08/2016  . Mixed hyperlipidemia 03/01/2016    Prior to Admission medications   Medication Sig Start Date End Date Taking? Authorizing Provider  albuterol (PROVENTIL HFA;VENTOLIN HFA) 108 (90 Base) MCG/ACT inhaler Inhale 2 puffs into the lungs every 6 (six) hours as needed for wheezing or shortness of breath. 09/08/16  Yes Reubin Milan, MD  atorvastatin (LIPITOR) 10 MG tablet TAKE 1 TABLET BY MOUTH EVERY DAY 03/08/17  Yes Reubin Milan, MD  cetirizine (ZYRTEC) 10 MG tablet Take 10 mg by  mouth as needed for allergies.   Yes [provider]    No Known Allergies  Past Surgical History:  Procedure Laterality Date  . NO PAST SURGERIES      Social History   Tobacco Use  . Smoking status: Never Smoker  . Smokeless tobacco: Never Used  Substance Use Topics  . Alcohol use: Yes    Alcohol/week: 0.0 oz  . Drug use: No     Medication list has been reviewed and updated.  PHQ 2/9 Scores 03/15/2017 03/01/2016  PHQ - 2 Score 0 0    Physical Exam  Constitutional: He is oriented to person, place, and time. He appears well-developed. No distress.  HENT:  Head: Normocephalic and atraumatic.  Neck: Normal range of motion. Neck supple. Carotid bruit is not present.  Cardiovascular: Normal rate, regular rhythm and normal heart sounds.  Pulmonary/Chest: Effort normal and breath sounds normal. No respiratory distress.  Musculoskeletal: Normal range of motion.  Neurological: He is alert and oriented to person, place, and time.  Skin: Skin is warm and dry. No rash noted.  Psychiatric: He has a normal mood and affect. His speech is normal and behavior is normal. Thought content normal. His mood appears not anxious. He does not exhibit a depressed mood.  Nursing note and vitals reviewed.   BP 122/84   Pulse 86   Ht 6' (1.829 m)   Wt 192 lb (87.1 kg)   SpO2 100%   BMI 26.04 kg/m   Assessment and Plan: 1. Mixed hyperlipidemia Continue statin therapy - will advise of results - Comprehensive metabolic  panel - Lipid panel   No orders of the defined types were placed in this encounter.   Partially dictated using Animal nutritionistDragon software. Any errors are unintentional.  Bari EdwardLaura Emanuelle Hammerstrom, MD [facility]  03/15/2017

## 2017-03-16 LAB — COMPREHENSIVE METABOLIC PANEL
ALT: 30 IU/L (ref 0–44)
AST: 25 IU/L (ref 0–40)
Albumin/Globulin Ratio: 1.6 (ref 1.2–2.2)
Albumin: 5.1 g/dL (ref 3.5–5.5)
Alkaline Phosphatase: 78 IU/L (ref 39–117)
BUN/Creatinine Ratio: 15 (ref 9–20)
BUN: 16 mg/dL (ref 6–20)
Bilirubin Total: 0.6 mg/dL (ref 0.0–1.2)
CALCIUM: 9.8 mg/dL (ref 8.7–10.2)
CHLORIDE: 99 mmol/L (ref 96–106)
CO2: 26 mmol/L (ref 20–29)
Creatinine, Ser: 1.04 mg/dL (ref 0.76–1.27)
GFR calc Af Amer: 109 mL/min/{1.73_m2} (ref >59)
GFR, EST NON AFRICAN AMERICAN: 94 mL/min/{1.73_m2} (ref >59)
GLUCOSE: 94 mg/dL (ref 65–99)
Globulin, Total: 3.2 g/dL (ref 1.5–4.5)
Potassium: 4.6 mmol/L (ref 3.5–5.2)
Sodium: 141 mmol/L (ref 134–144)
TOTAL PROTEIN: 8.3 g/dL (ref 6.0–8.5)

## 2017-03-16 LAB — LIPID PANEL
CHOL/HDL RATIO: 5.5 ratio — AB (ref 0.0–5.0)
Cholesterol, Total: 219 mg/dL — ABNORMAL HIGH (ref 100–199)
HDL: 40 mg/dL (ref >39)
LDL Calculated: 141 mg/dL — ABNORMAL HIGH (ref 0–99)
TRIGLYCERIDES: 188 mg/dL — AB (ref 0–149)
VLDL CHOLESTEROL CAL: 38 mg/dL (ref 5–40)

## 2017-07-19 ENCOUNTER — Other Ambulatory Visit: Payer: Self-pay

## 2017-07-19 ENCOUNTER — Ambulatory Visit
Admission: EM | Admit: 2017-07-19 | Discharge: 2017-07-19 | Disposition: A | Discharge status: Home / Self Care | Payer: 59 | Attending: Family Medicine | Admitting: Family Medicine

## 2017-07-19 ENCOUNTER — Encounter: Payer: Self-pay | Admitting: Emergency Medicine

## 2017-07-19 DIAGNOSIS — R69 Illness, unspecified: Secondary | ICD-10-CM | POA: Diagnosis not present

## 2017-07-19 DIAGNOSIS — J111 Influenza due to unidentified influenza virus with other respiratory manifestations: Secondary | ICD-10-CM

## 2017-07-19 MED ORDER — IBUPROFEN 800 MG PO TABS
800.0000 mg | ORAL_TABLET | Freq: Once | ORAL | Status: AC
  Administered 2017-07-19: 800 mg via ORAL

## 2017-07-19 MED ORDER — IBUPROFEN 800 MG PO TABS: 800.0000 mg | ORAL_TABLET | Freq: Three times a day (TID) | ORAL | 0 refills | Status: DC

## 2017-07-19 MED ORDER — OSELTAMIVIR PHOSPHATE 75 MG PO CAPS: 75.0000 mg | ORAL_CAPSULE | Freq: Two times a day (BID) | ORAL | 0 refills | Status: DC

## 2017-07-19 NOTE — Discharge Instructions (Signed)
Rest, fluids.  Tylenol 1000 mg three times daily as needed.  Motrin as prescribed.  Tamiflu as prescribed.  Take care  Dr. Adriana Simas

## 2017-07-19 NOTE — ED Triage Notes (Signed)
Patient in today c/o not feeling well since last night. Has felt feverish, headache and slight body aches. Temperature at work this afternoon at work was 103. Patient has tried OTC Tylenol with his last dose being at 4:30am.

## 2017-07-19 NOTE — ED Provider Notes (Signed)
[facility]    CSN: [id number] Arrival date & time: 07/19/17  1434  History   Chief Complaint Chief Complaint  Patient presents with  . Fever   HPI   32 year old male presents with fever and body aches.  Started last night.  Started abruptly with high fever, T-max 103.  Associated headache, and body aches.  He is taken over-the-counter Tylenol without improvement.  Patient thinks that he has the flu.  No known exacerbating factors.  No reported sick contacts.  No other associated symptoms.  No other complaints.  Past Medical History:  Diagnosis Date  . Asthma   . Hyperlipidemia    Patient Active Problem List   Diagnosis Date Noted  . Multiple environmental allergies 09/08/2016  . Mixed hyperlipidemia 03/01/2016   Past Surgical History:  Procedure Laterality Date  . NO PAST SURGERIES     Home Medications    Prior to Admission medications   Medication Sig Start Date End Date Taking? Authorizing Provider  atorvastatin (LIPITOR) 10 MG tablet TAKE 1 TABLET BY MOUTH EVERY DAY 03/08/17  Yes Reubin Milan, MD  cetirizine (ZYRTEC) 10 MG tablet Take 10 mg by mouth as needed for allergies.   Yes [provider]  albuterol (PROVENTIL HFA;VENTOLIN HFA) 108 (90 Base) MCG/ACT inhaler Inhale 2 puffs into the lungs every 6 (six) hours as needed for wheezing or shortness of breath. 09/08/16   Reubin Milan, MD  ibuprofen (ADVIL,MOTRIN) 800 MG tablet Take 1 tablet (800 mg total) by mouth 3 (three) times daily. 07/19/17   Tommie Sams, DO  oseltamivir (TAMIFLU) 75 MG capsule Take 1 capsule (75 mg total) by mouth every 12 (twelve) hours. 07/19/17   Tommie Sams, DO   Family History Family History  Problem Relation Age of Onset  . Hypertension Mother   . Hyperlipidemia Mother   . Diabetes Father    Social History Social History   Tobacco Use  . Smoking status: Never Smoker  . Smokeless tobacco: Never Used  Substance Use Topics  . Alcohol use: Yes   Alcohol/week: 0.0 oz    Comment: socially  . Drug use: No   Allergies   Patient has no known allergies.  Review of Systems Review of Systems  Constitutional: Positive for fever.  Musculoskeletal:       Body aches.  Neurological: Positive for headaches.   Physical Exam Triage Vital Signs ED Triage Vitals  Enc Vitals Group     BP 07/19/17 1500 115/69     Pulse Rate 07/19/17 1500 (!) 133     Resp 07/19/17 1500 16     Temp 07/19/17 1500 (!) 100.7 F (38.2 C)     Temp Source 07/19/17 1500 Oral     SpO2 07/19/17 1500 100 %     Weight 07/19/17 1501 197 lb (89.4 kg)     Height 07/19/17 1501 6' (1.829 m)     Head Circumference --      Peak Flow --      Pain Score 07/19/17 1501 5     Pain Loc --      Pain Edu? --      Excl. in GC? --    Updated Vital Signs BP 115/69 (BP Location: Left Arm)   Pulse (!) 133   Temp (!) 100.7 F (38.2 C) (Oral)   Resp 16   Ht 6' (1.829 m)   Wt 197 lb (89.4 kg)   SpO2 100%   BMI 26.72  kg/m   Physical Exam  Constitutional: He is oriented to person, place, and time. He appears well-developed. No distress.  HENT:  Head: Normocephalic and atraumatic.  Mouth/Throat: Oropharynx is clear and moist.  Cardiovascular:  Tachycardic.  Regular rhythm.  Pulmonary/Chest: Effort normal and breath sounds normal. He has no wheezes. He has no rales.  Neurological: He is alert and oriented to person, place, and time.  Psychiatric: He has a normal mood and affect. His behavior is normal.  Nursing note and vitals reviewed.  UC Treatments / Results  Labs (all labs ordered are listed, but only abnormal results are displayed) Labs Reviewed - No data to display  EKG None  Radiology No results found.  Procedures Procedures (including critical care time)  Medications Ordered in UC Medications  ibuprofen (ADVIL,MOTRIN) tablet 800 mg (800 mg Oral Given 07/19/17 1507)    Initial Impression / Assessment and Plan / UC Course  I have reviewed the triage  vital signs and the nursing notes.  Pertinent labs & imaging results that were available during my care of the patient were reviewed by me and considered in my medical decision making (see chart for details).    32 year old male presents with an influenza-like illness.  Treating with Tamiflu, Motrin.  Tylenol as needed.  Supportive care.  Final Clinical Impressions(s) / UC Diagnoses   Final diagnoses:  Influenza-like illness     Discharge Instructions     Rest, fluids.  Tylenol 1000 mg three times daily as needed.  Motrin as prescribed.  Tamiflu as prescribed.  Take care  Dr. Adriana Simas    ED Prescriptions    Medication Sig Dispense Auth. Provider   oseltamivir (TAMIFLU) 75 MG capsule Take 1 capsule (75 mg total) by mouth every 12 (twelve) hours. 10 capsule Mustaf Antonacci G, DO   ibuprofen (ADVIL,MOTRIN) 800 MG tablet Take 1 tablet (800 mg total) by mouth 3 (three) times daily. 30 tablet Tommie Sams, DO     Controlled Substance Prescriptions [place] Controlled Substance Registry consulted? Not Applicable   Tommie Sams, DO 07/19/17 1612

## 2017-09-11 ENCOUNTER — Encounter: Payer: 59 | Admitting: Internal Medicine

## 2018-01-11 ENCOUNTER — Ambulatory Visit (INDEPENDENT_AMBULATORY_CARE_PROVIDER_SITE_OTHER): Payer: 59 | Admitting: Internal Medicine

## 2018-01-11 ENCOUNTER — Encounter: Payer: Self-pay | Admitting: Internal Medicine

## 2018-01-11 VITALS — BP 108/80 | HR 110 | Temp 98.5°F | Ht 72.0 in | Wt 199.0 lb

## 2018-01-11 DIAGNOSIS — M545 Low back pain, unspecified: Secondary | ICD-10-CM

## 2018-01-11 DIAGNOSIS — R509 Fever, unspecified: Secondary | ICD-10-CM

## 2018-01-11 DIAGNOSIS — R3129 Other microscopic hematuria: Secondary | ICD-10-CM | POA: Diagnosis not present

## 2018-01-11 DIAGNOSIS — R6889 Other general symptoms and signs: Secondary | ICD-10-CM

## 2018-01-11 LAB — POC URINALYSIS WITH MICROSCOPIC (NON AUTO)MANUAL RESULT
BACTERIA UA: 0
Bilirubin, UA: NEGATIVE
CRYSTALS: 0
Epithelial cells, urine per micros: 0
Glucose, UA: NEGATIVE
KETONES UA: NEGATIVE
Leukocytes, UA: NEGATIVE
MUCUS UA: 0
Nitrite, UA: NEGATIVE
PH UA: 5 (ref 5.0–8.0)
PROTEIN UA: NEGATIVE
RBC: 0 M/uL — AB (ref 4.69–6.13)
SPEC GRAV UA: 1.015 (ref 1.010–1.025)
UROBILINOGEN UA: 0.2 U/dL
WBC Casts, UA: 0

## 2018-01-11 NOTE — Patient Instructions (Signed)
Advil 400 mg three times a day  Can take tylenol additionally at night if needed

## 2018-01-11 NOTE — Progress Notes (Signed)
Date:  01/11/2018   Name:  [facility]na [facility]   DOB:  05/26/1985   MRN:  [id number]   Chief Complaint: Fever (Fever with lower back pain. Started one week ago. Patient is concerned of the flu. )  Fever   This is a new problem. The current episode started in the past 7 days. The problem occurs constantly (but higher at night). The maximum temperature noted was 102 to 102.9 F. Associated symptoms include muscle aches (and mid back pain). Pertinent negatives include no abdominal pain, chest pain, coughing, headaches, rash, sore throat or wheezing. He has tried acetaminophen for the symptoms. The treatment provided mild relief.  Recent travel: to [place]  one month ago - was there for 2 weeks.   He did take malaria prophylaxis but he did use mosquito spray liberally.  He admits to getting several bites. He complains of mid back pain that is moderately severe.  It did improve after taking advil yesterday. He has not taken any tylenol or advil today.  Review of Systems  Constitutional: Positive for chills, fatigue and fever. Negative for diaphoresis and unexpected weight change.  HENT: Negative for postnasal drip, sore throat and trouble swallowing.   Respiratory: Negative for cough, chest tightness, shortness of breath and wheezing.   Cardiovascular: Negative for chest pain and palpitations.  Gastrointestinal: Negative for abdominal pain, blood in stool and constipation.  Genitourinary: Negative for frequency, hematuria and urgency.  Musculoskeletal: Positive for back pain and myalgias. Negative for arthralgias.  Skin: Negative for color change, rash and wound.  Neurological: Positive for light-headedness. Negative for dizziness and headaches.    Patient Active Problem List   Diagnosis Date Noted  . Multiple environmental allergies 09/08/2016  . Mixed hyperlipidemia 03/01/2016    No Known Allergies  Past Surgical History:  Procedure Laterality Date  . NO PAST SURGERIES      Social  History   Tobacco Use  . Smoking status: Never Smoker  . Smokeless tobacco: Never Used  Substance Use Topics  . Alcohol use: Yes    Alcohol/week: 0.0 standard drinks    Comment: socially  . Drug use: No     Medication list has been reviewed and updated.  Current Meds  Medication Sig  . albuterol (PROVENTIL HFA;VENTOLIN HFA) 108 (90 Base) MCG/ACT inhaler Inhale 2 puffs into the lungs every 6 (six) hours as needed for wheezing or shortness of breath.  Marland Kitchen. atorvastatin (LIPITOR) 10 MG tablet TAKE 1 TABLET BY MOUTH EVERY DAY  . cetirizine (ZYRTEC) 10 MG tablet Take 10 mg by mouth as needed for allergies.  Marland Kitchen. ibuprofen (ADVIL,MOTRIN) 800 MG tablet Take 1 tablet (800 mg total) by mouth 3 (three) times daily.    PHQ 2/9 Scores 03/15/2017 03/01/2016  PHQ - 2 Score 0 0    Physical Exam  Constitutional: He is oriented to person, place, and time. He appears well-developed and well-nourished. No distress.  HENT:  Head: Normocephalic and atraumatic.  Right Ear: Tympanic membrane and ear canal normal.  Left Ear: Tympanic membrane and ear canal normal.  Nose: Right sinus exhibits no maxillary sinus tenderness. Left sinus exhibits no maxillary sinus tenderness.  Mouth/Throat: No posterior oropharyngeal edema or posterior oropharyngeal erythema.  Eyes: Pupils are equal, round, and reactive to light.  Neck: Normal range of motion. Neck supple.  Cardiovascular: Normal rate, regular rhythm and normal heart sounds.  Pulmonary/Chest: Effort normal. No respiratory distress.  Abdominal: Soft. Normal appearance and bowel sounds are normal. There is  no tenderness. There is no rigidity and no guarding.  Musculoskeletal: Normal range of motion.       Thoracic back: He exhibits tenderness (muscular tenderness L>R; no definite CVA tenderness) and spasm.  Lymphadenopathy:    He has no cervical adenopathy.  Neurological: He is alert and oriented to person, place, and time.  Skin: Skin is warm, dry and intact.  No rash noted.  Psychiatric: He has a normal mood and affect. His behavior is normal. Thought content normal.  Nursing note and vitals reviewed.  Urine dipstick shows positive for RBC's.  Micro exam: negative for WBC's or RBC's.  BP 108/80 (BP Location: Right Arm, Patient Position: Sitting, Cuff Size: Normal)   Pulse (!) 110   Temp 98.5 F (36.9 C) (Oral)   Ht 6' (1.829 m)   Wt 199 lb (90.3 kg)   SpO2 100%   BMI 26.99 kg/m   Assessment and Plan: 1. Fever, unspecified fever cause May be viral - exam non focal Recommend Advil 400 mg q8 hours Tylenol at night if needed - CBC with Differential/Platelet  2. Suspected malaria Rule out malaria - Parasite Exam, Blood  3. Other microscopic hematuria UA micro negative Less concern for renal process  4. Acute midline low back pain without sciatica Continue advil, hydrate Go to ED if severe pain not relieved by nsaids - POC urinalysis w microscopic (non auto)   Partially dictated using Animal nutritionist. Any errors are unintentional.  Bari Edward, MD [facility]  01/11/2018

## 2018-01-15 ENCOUNTER — Ambulatory Visit
Admission: RE | Admit: 2018-01-15 | Discharge: 2018-01-15 | Disposition: A | Discharge status: Home / Self Care | Payer: 59 | Source: Ambulatory Visit | Attending: Internal Medicine | Admitting: Internal Medicine

## 2018-01-15 ENCOUNTER — Encounter (INDEPENDENT_AMBULATORY_CARE_PROVIDER_SITE_OTHER): Payer: Self-pay

## 2018-01-15 ENCOUNTER — Other Ambulatory Visit: Payer: Self-pay | Admitting: Internal Medicine

## 2018-01-15 DIAGNOSIS — M546 Pain in thoracic spine: Secondary | ICD-10-CM | POA: Insufficient documentation

## 2018-01-15 LAB — CBC WITH DIFFERENTIAL/PLATELET
BASOS ABS: 0.1 10*3/uL (ref 0.0–0.2)
Basos: 0 %
EOS (ABSOLUTE): 0.2 10*3/uL (ref 0.0–0.4)
Eos: 1 %
HEMOGLOBIN: 13.9 g/dL (ref 13.0–17.7)
Hematocrit: 41.7 % (ref 37.5–51.0)
Immature Grans (Abs): 0 10*3/uL (ref 0.0–0.1)
Immature Granulocytes: 0 %
LYMPHS ABS: 1.8 10*3/uL (ref 0.7–3.1)
LYMPHS: 14 %
MCH: 26.4 pg — ABNORMAL LOW (ref 26.6–33.0)
MCHC: 33.3 g/dL (ref 31.5–35.7)
MCV: 79 fL (ref 79–97)
MONOCYTES: 10 %
Monocytes Absolute: 1.3 10*3/uL — ABNORMAL HIGH (ref 0.1–0.9)
Neutrophils Absolute: 9.6 10*3/uL — ABNORMAL HIGH (ref 1.4–7.0)
Neutrophils: 75 %
Platelets: 207 10*3/uL (ref 150–450)
RBC: 5.26 x10E6/uL (ref 4.14–5.80)
RDW: 14.4 % (ref 12.3–15.4)
WBC: 13 10*3/uL — AB (ref 3.4–10.8)

## 2018-01-15 LAB — PARASITE EXAM, BLOOD

## 2018-01-15 NOTE — Progress Notes (Signed)
Patient informed of labs. Feeling a lot better. Still experiencing discomfort in back on and off. Wanted to know if we could do a back xray ( for him to feel more relieved that nothing is wrong with back).  Please Advise.

## 2018-02-13 ENCOUNTER — Encounter: Payer: Self-pay | Admitting: Internal Medicine

## 2018-02-13 ENCOUNTER — Ambulatory Visit (INDEPENDENT_AMBULATORY_CARE_PROVIDER_SITE_OTHER): Payer: 59 | Admitting: Internal Medicine

## 2018-02-13 VITALS — BP 124/70 | HR 96 | Ht 72.0 in | Wt 196.0 lb

## 2018-02-13 DIAGNOSIS — Z23 Encounter for immunization: Secondary | ICD-10-CM | POA: Diagnosis not present

## 2018-02-13 DIAGNOSIS — Z9109 Other allergy status, other than to drugs and biological substances: Secondary | ICD-10-CM

## 2018-02-13 DIAGNOSIS — Z0000 Encounter for general adult medical examination without abnormal findings: Secondary | ICD-10-CM | POA: Diagnosis not present

## 2018-02-13 DIAGNOSIS — E782 Mixed hyperlipidemia: Secondary | ICD-10-CM | POA: Diagnosis not present

## 2018-02-13 LAB — POCT URINALYSIS DIPSTICK
Bilirubin, UA: NEGATIVE
Glucose, UA: NEGATIVE
Ketones, UA: NEGATIVE
LEUKOCYTES UA: NEGATIVE
NITRITE UA: NEGATIVE
PROTEIN UA: NEGATIVE
RBC UA: NEGATIVE
SPEC GRAV UA: 1.02 (ref 1.010–1.025)
Urobilinogen, UA: 0.2 E.U./dL
pH, UA: 5 (ref 5.0–8.0)

## 2018-02-13 MED ORDER — ALBUTEROL SULFATE HFA 108 (90 BASE) MCG/ACT IN AERS: 2.0000 | INHALATION_SPRAY | Freq: Four times a day (QID) | RESPIRATORY_TRACT | 2 refills | Status: DC | PRN

## 2018-02-13 NOTE — Progress Notes (Signed)
Date:  02/13/2018   Name:  Harold Hill [facility]   DOB:  08-24-1985   MRN:  [id number]   Chief Complaint: Annual Exam Harold Hill is a 32 y.o. male who presents today for his Complete Annual Exam. He feels fairly well. He reports exercising regularly. He reports he is sleeping well. He and his wife recently lost a baby at 21 weeks to cervical incompetence.  They are grieving normally and should be able to conceive again soone  He has recovered from his febrile illness.  His back pain resolved at the same time and has not recurred.  His xrays were normal.  HPI - seasonal and environmental allergies - uses albuterol inhaler as needed.  He also takes zyrtec as needed.  His triggers are mainly trees and grasses in the spring.  He is not aware of any food triggers.  He has never had allergy testing.  Review of Systems  Constitutional: Negative for appetite change, chills, diaphoresis, fatigue and unexpected weight change.  HENT: Negative for hearing loss, tinnitus, trouble swallowing and voice change.   Eyes: Negative for visual disturbance.  Respiratory: Positive for wheezing (with allergic reactions). Negative for choking and shortness of breath.   Cardiovascular: Negative for chest pain, palpitations and leg swelling.  Gastrointestinal: Negative for abdominal pain, blood in stool, constipation and diarrhea.  Genitourinary: Negative for difficulty urinating, dysuria and frequency.  Musculoskeletal: Negative for arthralgias, back pain and myalgias.  Skin: Negative for color change and rash.  Allergic/Immunologic: Positive for environmental allergies. Negative for food allergies.  Neurological: Negative for dizziness, syncope and headaches.  Hematological: Negative for adenopathy.  Psychiatric/Behavioral: Negative for dysphoric mood and sleep disturbance.    Patient Active Problem List   Diagnosis Date Noted  . Multiple environmental allergies 09/08/2016  . Mixed hyperlipidemia  03/01/2016    No Known Allergies  Past Surgical History:  Procedure Laterality Date  . NO PAST SURGERIES      Social History   Tobacco Use  . Smoking status: Never Smoker  . Smokeless tobacco: Never Used  Substance Use Topics  . Alcohol use: Yes    Alcohol/week: 0.0 standard drinks    Comment: socially  . Drug use: No     Medication list has been reviewed and updated.  Current Meds  Medication Sig  . albuterol (PROVENTIL HFA;VENTOLIN HFA) 108 (90 Base) MCG/ACT inhaler Inhale 2 puffs into the lungs every 6 (six) hours as needed for wheezing or shortness of breath.  . cetirizine (ZYRTEC) 10 MG tablet Take 10 mg by mouth as needed for allergies.  Marland Kitchen ibuprofen (ADVIL,MOTRIN) 800 MG tablet Take 1 tablet (800 mg total) by mouth 3 (three) times daily.    PHQ 2/9 Scores 02/13/2018 03/15/2017 03/01/2016  PHQ - 2 Score 0 0 0    Physical Exam Vitals signs and nursing note reviewed.  Constitutional:      Appearance: Normal appearance. He is well-developed.  HENT:     Head: Normocephalic.     Right Ear: Tympanic membrane, ear canal and external ear normal.     Left Ear: Tympanic membrane, ear canal and external ear normal.     Nose: Nose normal.     Mouth/Throat:     Pharynx: Uvula midline.  Eyes:     Conjunctiva/sclera: Conjunctivae normal.     Pupils: Pupils are equal, round, and reactive to light.  Neck:     Musculoskeletal: Normal range of motion and neck supple.     Thyroid:  No thyromegaly.     Vascular: No carotid bruit.  Cardiovascular:     Rate and Rhythm: Normal rate and regular rhythm.     Heart sounds: Normal heart sounds.  Pulmonary:     Effort: Pulmonary effort is normal.     Breath sounds: Normal breath sounds. No wheezing.  Chest:     Breasts:        Right: No mass.        Left: No mass.  Abdominal:     General: Bowel sounds are normal.     Palpations: Abdomen is soft.     Tenderness: There is no abdominal tenderness.  Musculoskeletal: Normal range of  motion.  Lymphadenopathy:     Cervical: No cervical adenopathy.  Skin:    General: Skin is warm and dry.  Neurological:     Mental Status: He is alert and oriented to person, place, and time.     Deep Tendon Reflexes: Reflexes are normal and symmetric.  Psychiatric:        Speech: Speech normal.        Behavior: Behavior normal.        Thought Content: Thought content normal.        Judgment: Judgment normal.    Wt Readings from Last 3 Encounters:  02/13/18 196 lb (88.9 kg)  01/11/18 199 lb (90.3 kg)  07/19/17 197 lb (89.4 kg)    BP 124/70 (BP Location: Right Arm, Patient Position: Sitting, Cuff Size: Normal)   Pulse 96   Ht 6' (1.829 m)   Wt 196 lb (88.9 kg)   SpO2 99%   BMI 26.58 kg/m   Assessment and Plan: 1. Annual physical exam Normal exam Continue healthy diet and exercise - Comprehensive metabolic panel - POCT urinalysis dipstick - Urine dipstick shows negative for all components.  Micro exam: not done.   2. Multiple environmental allergies Continue zyrtec daily during the spring; continue albuterol PRN - albuterol (PROVENTIL HFA;VENTOLIN HFA) 108 (90 Base) MCG/ACT inhaler; Inhale 2 puffs into the lungs every 6 (six) hours as needed for wheezing or shortness of breath.  Dispense: 18 g; Refill: 2 - CBC with Differential/Platelet  3. Mixed hyperlipidemia Check labs and advise (will compare to last years labs on medication) - Lipid panel  4. Need for immunization against influenza - Flu Vaccine QUAD 36+ mos IM   Partially dictated using Animal nutritionistDragon software. Any errors are unintentional.  Bari EdwardLaura Amayrany Cafaro, MD [facility]  02/13/2018

## 2018-02-14 LAB — CBC WITH DIFFERENTIAL/PLATELET
Basophils Absolute: 0 10*3/uL (ref 0.0–0.2)
Basos: 1 %
EOS (ABSOLUTE): 0.2 10*3/uL (ref 0.0–0.4)
Eos: 3 %
Hematocrit: 43.7 % (ref 37.5–51.0)
Hemoglobin: 14.3 g/dL (ref 13.0–17.7)
IMMATURE GRANULOCYTES: 0 %
Immature Grans (Abs): 0 10*3/uL (ref 0.0–0.1)
Lymphocytes Absolute: 1.7 10*3/uL (ref 0.7–3.1)
Lymphs: 29 %
MCH: 26 pg — ABNORMAL LOW (ref 26.6–33.0)
MCHC: 32.7 g/dL (ref 31.5–35.7)
MCV: 80 fL (ref 79–97)
MONOS ABS: 0.6 10*3/uL (ref 0.1–0.9)
Monocytes: 11 %
NEUTROS PCT: 56 %
Neutrophils Absolute: 3.3 10*3/uL (ref 1.4–7.0)
Platelets: 237 10*3/uL (ref 150–450)
RBC: 5.49 x10E6/uL (ref 4.14–5.80)
RDW: 14.4 % (ref 12.3–15.4)
WBC: 5.9 10*3/uL (ref 3.4–10.8)

## 2018-02-14 LAB — COMPREHENSIVE METABOLIC PANEL
A/G RATIO: 1.5 (ref 1.2–2.2)
ALT: 32 IU/L (ref 0–44)
AST: 24 IU/L (ref 0–40)
Albumin: 4.6 g/dL (ref 3.5–5.5)
Alkaline Phosphatase: 85 IU/L (ref 39–117)
BUN/Creatinine Ratio: 16 (ref 9–20)
BUN: 16 mg/dL (ref 6–20)
Bilirubin Total: 0.4 mg/dL (ref 0.0–1.2)
CALCIUM: 9.4 mg/dL (ref 8.7–10.2)
CO2: 24 mmol/L (ref 20–29)
CREATININE: 1.02 mg/dL (ref 0.76–1.27)
Chloride: 101 mmol/L (ref 96–106)
GFR calc Af Amer: 110 mL/min/{1.73_m2} (ref >59)
GFR, EST NON AFRICAN AMERICAN: 95 mL/min/{1.73_m2} (ref >59)
GLUCOSE: 103 mg/dL — AB (ref 65–99)
Globulin, Total: 3.1 g/dL (ref 1.5–4.5)
POTASSIUM: 4.1 mmol/L (ref 3.5–5.2)
Sodium: 139 mmol/L (ref 134–144)
Total Protein: 7.7 g/dL (ref 6.0–8.5)

## 2018-02-14 LAB — LIPID PANEL
CHOL/HDL RATIO: 7.9 ratio — AB (ref 0.0–5.0)
Cholesterol, Total: 236 mg/dL — ABNORMAL HIGH (ref 100–199)
HDL: 30 mg/dL — AB (ref >39)
LDL Calculated: 163 mg/dL — ABNORMAL HIGH (ref 0–99)
TRIGLYCERIDES: 216 mg/dL — AB (ref 0–149)
VLDL Cholesterol Cal: 43 mg/dL — ABNORMAL HIGH (ref 5–40)

## 2018-02-21 ENCOUNTER — Other Ambulatory Visit: Payer: Self-pay | Admitting: Internal Medicine

## 2018-02-21 DIAGNOSIS — E782 Mixed hyperlipidemia: Secondary | ICD-10-CM

## 2018-02-21 MED ORDER — ATORVASTATIN CALCIUM 20 MG PO TABS: 20.0000 mg | ORAL_TABLET | Freq: Every day | ORAL | 1 refills | Status: DC

## 2018-02-21 NOTE — Progress Notes (Signed)
Spoke with patient about labs. He said he had NO side effects and agrees to go back on Lipitor at a higher dose. [facility]   Please Advise.

## 2018-08-08 ENCOUNTER — Other Ambulatory Visit: Payer: Self-pay

## 2018-08-08 ENCOUNTER — Encounter: Payer: Self-pay | Admitting: Internal Medicine

## 2018-08-08 ENCOUNTER — Ambulatory Visit (INDEPENDENT_AMBULATORY_CARE_PROVIDER_SITE_OTHER): Payer: 59 | Admitting: Internal Medicine

## 2018-08-08 VITALS — BP 126/82 | HR 82 | Ht 72.0 in | Wt 205.0 lb

## 2018-08-08 DIAGNOSIS — E782 Mixed hyperlipidemia: Secondary | ICD-10-CM

## 2018-08-08 NOTE — Progress Notes (Signed)
Date:  08/08/2018   Name:  Harold Hill [facility]   DOB:  24-Oct-1985   MRN:  [id number]   Chief Complaint: Hyperlipidemia and Numbness (Both hands become numb after walking.  He walks 5 minles everyday. )  Hyperlipidemia This is a chronic problem. Pertinent negatives include no chest pain or shortness of breath. Current antihyperlipidemic treatment includes statins (resumed lipitor 6 months ago). The current treatment provides moderate improvement of lipids. There are no compliance problems.   He is now exercising regularly - walking 5 miles a day.  He notices some swelling in his fingers during the walk.  He wonders what he should do.  Review of Systems  Constitutional: Negative for chills, fatigue, fever and unexpected weight change.  Respiratory: Negative for cough, chest tightness, shortness of breath and wheezing.   Cardiovascular: Negative for chest pain, palpitations and leg swelling.  Skin: Negative for color change and rash.  Neurological: Negative for dizziness and headaches.  Psychiatric/Behavioral: Negative for dysphoric mood.    Patient Active Problem List   Diagnosis Date Noted  . Multiple environmental allergies 09/08/2016  . Mixed hyperlipidemia 03/01/2016    No Known Allergies  Past Surgical History:  Procedure Laterality Date  . NO PAST SURGERIES      Social History   Tobacco Use  . Smoking status: Never Smoker  . Smokeless tobacco: Never Used  Substance Use Topics  . Alcohol use: Yes    Alcohol/week: 0.0 standard drinks    Comment: socially  . Drug use: No     Medication list has been reviewed and updated.  Current Meds  Medication Sig  . albuterol (PROVENTIL HFA;VENTOLIN HFA) 108 (90 Base) MCG/ACT inhaler Inhale 2 puffs into the lungs every 6 (six) hours as needed for wheezing or shortness of breath.  Marland Kitchen atorvastatin (LIPITOR) 20 MG tablet Take 1 tablet (20 mg total) by mouth daily.  . cetirizine (ZYRTEC) 10 MG tablet Take 10 mg by mouth as needed  for allergies.  Marland Kitchen ibuprofen (ADVIL,MOTRIN) 800 MG tablet Take 1 tablet (800 mg total) by mouth 3 (three) times daily.    PHQ 2/9 Scores 08/08/2018 02/13/2018 03/15/2017 03/01/2016  PHQ - 2 Score 0 0 0 0    BP Readings from Last 3 Encounters:  08/08/18 126/82  02/13/18 124/70  01/11/18 108/80    Physical Exam Vitals signs and nursing note reviewed.  Constitutional:      General: He is not in acute distress.    Appearance: He is well-developed.  HENT:     Head: Normocephalic and atraumatic.  Neck:     Musculoskeletal: Normal range of motion.     Vascular: No carotid bruit.  Cardiovascular:     Rate and Rhythm: Normal rate and regular rhythm.     Pulses: Normal pulses.     Heart sounds: No murmur.  Pulmonary:     Effort: Pulmonary effort is normal. No respiratory distress.  Musculoskeletal: Normal range of motion.     Right lower leg: No edema.     Left lower leg: No edema.  Lymphadenopathy:     Cervical: No cervical adenopathy.  Skin:    General: Skin is warm and dry.     Capillary Refill: Capillary refill takes less than 2 seconds.     Findings: No rash.  Neurological:     Mental Status: He is alert and oriented to person, place, and time.  Psychiatric:        Behavior: Behavior normal.  Thought Content: Thought content normal.     Wt Readings from Last 3 Encounters:  08/08/18 205 lb (93 kg)  02/13/18 196 lb (88.9 kg)  01/11/18 199 lb (90.3 kg)    BP 126/82   Pulse 82   Ht 6' (1.829 m)   Wt 205 lb (93 kg)   SpO2 96%   BMI 27.80 kg/m   Assessment and Plan: 1. Mixed hyperlipidemia Continue Lipitor 20 mg daily Continue regular exercise and healthy diet - Lipid panel - Comprehensive metabolic panel - TSH   Partially dictated using Animal nutritionistDragon software. Any errors are unintentional.  Bari EdwardLaura Alexa Blish, MD [facility]  08/08/2018

## 2018-08-09 LAB — COMPREHENSIVE METABOLIC PANEL WITH GFR
ALT: 26 [IU]/L (ref 0–44)
AST: 24 [IU]/L (ref 0–40)
Albumin/Globulin Ratio: 1.5 (ref 1.2–2.2)
Albumin: 4.5 g/dL (ref 4.0–5.0)
Alkaline Phosphatase: 78 [IU]/L (ref 39–117)
BUN/Creatinine Ratio: 17 (ref 9–20)
BUN: 15 mg/dL (ref 6–20)
Bilirubin Total: 0.5 mg/dL (ref 0.0–1.2)
CO2: 20 mmol/L (ref 20–29)
Calcium: 9.3 mg/dL (ref 8.7–10.2)
Chloride: 103 mmol/L (ref 96–106)
Creatinine, Ser: 0.88 mg/dL (ref 0.76–1.27)
GFR calc Af Amer: 130 mL/min/{1.73_m2}
GFR calc non Af Amer: 112 mL/min/{1.73_m2}
Globulin, Total: 3 g/dL (ref 1.5–4.5)
Glucose: 110 mg/dL — ABNORMAL HIGH (ref 65–99)
Potassium: 4.3 mmol/L (ref 3.5–5.2)
Sodium: 140 mmol/L (ref 134–144)
Total Protein: 7.5 g/dL (ref 6.0–8.5)

## 2018-08-09 LAB — LIPID PANEL
Chol/HDL Ratio: 5.4 ratio — ABNORMAL HIGH (ref 0.0–5.0)
Cholesterol, Total: 179 mg/dL (ref 100–199)
HDL: 33 mg/dL — ABNORMAL LOW
LDL Calculated: 114 mg/dL — ABNORMAL HIGH (ref 0–99)
Triglycerides: 160 mg/dL — ABNORMAL HIGH (ref 0–149)
VLDL Cholesterol Cal: 32 mg/dL (ref 5–40)

## 2018-08-09 LAB — TSH: TSH: 0.492 u[IU]/mL (ref 0.450–4.500)

## 2018-09-02 ENCOUNTER — Ambulatory Visit
Admission: EM | Admit: 2018-09-02 | Discharge: 2018-09-02 | Disposition: A | Discharge status: Home / Self Care | Payer: 59

## 2018-09-02 ENCOUNTER — Other Ambulatory Visit: Payer: Self-pay

## 2018-09-02 ENCOUNTER — Telehealth: Payer: Self-pay

## 2018-09-02 DIAGNOSIS — Z20828 Contact with and (suspected) exposure to other viral communicable diseases: Secondary | ICD-10-CM | POA: Diagnosis not present

## 2018-09-02 DIAGNOSIS — Z20822 Contact with and (suspected) exposure to covid-19: Secondary | ICD-10-CM

## 2018-09-02 NOTE — Discharge Instructions (Addendum)
It was very nice seeing you today in clinic. Thank you for entrusting me with your care.   Someone will contact you to schedule testing.   Make arrangements to follow up with your regular doctor if you develop any new or concerning symptoms. If your symptoms/condition worsens, please seek follow up care either here or in the ER. Please remember, our [place] providers are "right here with you" when you need [place].   Again, it was my pleasure to take care of you today. Thank you for choosing our clinic. I hope that you start to feel better quickly.   Honor Loh, MSN, APRN, FNP-C, CEN Advanced Practice Provider [facility]

## 2018-09-02 NOTE — ED Provider Notes (Signed)
[place], [place]   Name: Harold Hill DOB: 03/05/1985 MRN: [id number] CSN: [id number] PCP: Reubin MilanBerglund, Laura H, MD  Arrival date and time:  09/02/18 1213  Chief Complaint:  covid exposure   NOTE: Prior to seeing the patient today, I have reviewed the triage nursing documentation and vital signs. Clinical staff has updated patient's PMH/PSHx, current medication list, and drug allergies/intolerances to ensure comprehensive history available to assist in medical decision making.   History:   HPI: Harold Hill is a 33 y.o. male who presents today with concerns related to potential exposure to SARS-CoV-2 (novel coronavirus). Exposure was indirect in nature. Patient works closely with an individual at work who attended a wedding about 1.5 weeks ago where a guest tested positive for SARS-CoV-2. Patient, nor his colleague, have experienced any of the typical symptoms known to be associated with this particular virus. Patient has no significant PMH. He advises that he feels generally well today in clinic with the exception of being anxious. Patient's wife in currently in the midst of a high-risk pregnancy, hence his concerns related to SARS-CoV-2 exposure.   Past Medical History:  Diagnosis Date   Asthma    Hyperlipidemia     Past Surgical History:  Procedure Laterality Date   NO PAST SURGERIES      Family History  Problem Relation Age of Onset   Hypertension Mother    Hyperlipidemia Mother    Diabetes Father     Social History   Tobacco Use   Smoking status: Never Smoker   Smokeless tobacco: Never Used  Substance Use Topics   Alcohol use: Yes    Alcohol/week: 0.0 standard drinks    Comment: socially   Drug use: No    Patient Active Problem List   Diagnosis Date Noted   Multiple environmental allergies 09/08/2016   Mixed hyperlipidemia 03/01/2016    Home Medications:    Current Meds  Medication Sig   albuterol (PROVENTIL HFA;VENTOLIN HFA) 108 (90 Base)  MCG/ACT inhaler Inhale 2 puffs into the lungs every 6 (six) hours as needed for wheezing or shortness of breath.   cetirizine (ZYRTEC) 10 MG tablet Take 10 mg by mouth as needed for allergies.   ibuprofen (ADVIL,MOTRIN) 800 MG tablet Take 1 tablet (800 mg total) by mouth 3 (three) times daily.   [DISCONTINUED] atorvastatin (LIPITOR) 20 MG tablet Take 1 tablet (20 mg total) by mouth daily.    Allergies:   Patient has no known allergies.  Review of Systems (ROS): Review of Systems  Constitutional: Negative for activity change, appetite change, chills, fatigue and fever.  HENT: Negative for congestion, ear pain, rhinorrhea, sinus pressure, sinus pain and sore throat.   Respiratory: Negative for cough and shortness of breath.   Cardiovascular: Negative for chest pain and palpitations.  Gastrointestinal: Negative for abdominal pain, diarrhea, nausea and vomiting.  Musculoskeletal: Negative for arthralgias, back pain, myalgias, neck pain and neck stiffness.  Skin: Negative for color change and rash.  Neurological: Negative for dizziness, weakness, numbness and headaches.  Hematological: Negative for adenopathy.  Psychiatric/Behavioral: The patient is nervous/anxious.      Vital Signs: Today's Vitals   09/02/18 1235 09/02/18 1238 09/02/18 1258  BP: (!) 134/91    Pulse: 79    Resp: 16    Temp: 98.1 F (36.7 C)    TempSrc: Oral    SpO2: 100%    Weight:  195 lb (88.5 kg)   Height:  6' (1.829 m)   PainSc:  0-No pain  2     Physical Exam: Physical Exam  Constitutional: He is oriented to person, place, and time and well-developed, well-nourished, and in no distress.  HENT:  Head: Normocephalic and atraumatic.  Right Ear: External ear normal.  Left Ear: External ear normal.  Nose: Nose normal.  Mouth/Throat: Oropharynx is clear and moist and mucous membranes are normal.  Eyes: Pupils are equal, round, and reactive to light. Conjunctivae and EOM are normal. No scleral icterus.    Neck: Normal range of motion. Neck supple. No tracheal deviation present.  Cardiovascular: Normal rate, regular rhythm, normal heart sounds and intact distal pulses. Exam reveals no gallop and no friction rub.  No murmur heard. Pulmonary/Chest: Effort normal and breath sounds normal. No respiratory distress. He has no wheezes. He has no rales.  Neurological: He is alert and oriented to person, place, and time. Gait normal. GCS score is 15.  Skin: Skin is warm and dry. No rash noted.  Psychiatric: Memory, affect and judgment normal. His mood appears anxious.  Nursing note and vitals reviewed.   Urgent Care Treatments / Results:   LABS: PLEASE NOTE: all labs that were ordered this encounter are listed, however only abnormal results are displayed. Labs Reviewed - No data to display  EKG: -None  RADIOLOGY: No results found.  PROCEDURES: Procedures  MEDICATIONS RECEIVED THIS VISIT: Medications - No data to display  PERTINENT CLINICAL COURSE NOTES/UPDATES:   Initial Impression / Assessment and Plan / Urgent Care Course:  Pertinent labs & imaging results that were available during my care of the patient were personally reviewed by me and considered in my medical decision making (see lab/imaging section of note for values and interpretations).  Harold Hill is a 33 y.o. male [facility] complaints of covid [facility]   Patient overall well appearing and in no acute distress today in clinic. Exam is benign. Other than being anxious, the patient feels generally well. He presents following an indirect exposure to SARS-CoV-2 (novel coronavirus). Discussed typical symptom constellation. Patient concerned to his wife being in the midst of a high-risk pregnancy. Reviewed extremely low likelihood of infection, however patient is adamant that testing needs to be done. Given potential for exposure, testing is reasonable. [facility].  Order sent. Patient advised that he will be contacted later today to schedule an appointment. He was advised to self quarantine, per [facility] DHHS guidelines, until negative results received.   Discussed follow up with primary care physician should he develop any concerning symptoms. I have reviewed the follow up and strict return precautions for any new or worsening symptoms. Patient is aware of symptoms that would be deemed urgent/emergent, and would thus require further evaluation either here or in the emergency department. At the time of discharge, he verbalized understanding and consent with the discharge plan as it was reviewed with him. All questions were fielded by provider and/or clinic staff prior to patient discharge.    Final Clinical Impressions / Urgent Care Diagnoses:   Final diagnoses:  Exposure to Covid-19 Virus    New Prescriptions:  [place] Controlled Substance Registry consulted? Not Applicable  No orders of the defined types were placed in this encounter.   Recommended Follow up Care:  Patient encouraged to follow up with the following provider within the specified time frame, or sooner as dictated by the severity of his symptoms. As always, he was instructed that for any urgent/emergent care needs, he should  seek care either here or in the emergency department for more immediate evaluation. Follow-up Information    Glean Hess, MD.   Specialty: Internal Medicine Why: As needed if any symptoms develop or for any further concerns. Contact information: [address] [place] [address] [phone]          NOTE: This note was prepared using Dragon dictation software along with smaller phrase technology. Despite my best ability to proofread, there is the potential that transcriptional errors may still occur from this process, and are completely unintentional.     Karen Kitchens, NP 09/03/18 2112

## 2018-09-02 NOTE — Telephone Encounter (Addendum)
Patient called and advised of the referral for covid testing, he verbalized understanding. Appointment scheduled for tomorrow, 09/03/18 at 1330 at [facility], advised of location and to wear a mask for everyone in the vehicle, he verbalized understanding. Order placed.    ----- Message from Karen Kitchens, NP sent at 09/02/2018 12:47 PM EDT ----- Needs testing following an indirect exposure. Wife is in the midst of a high risk pregnancy.  Honor Loh, MSN, APRN, FNP-C, CEN Advanced Practice Provider [facility]

## 2018-09-02 NOTE — ED Triage Notes (Signed)
Mr. Mcfarlan here with report of potential covid exposure, no symptoms at this time and contact has no symptoms (contact person was exposed at a wedding 1.5 weeks ago)

## 2018-09-03 ENCOUNTER — Other Ambulatory Visit: Payer: 59

## 2018-09-03 DIAGNOSIS — Z20822 Contact with and (suspected) exposure to covid-19: Secondary | ICD-10-CM

## 2018-09-06 ENCOUNTER — Ambulatory Visit: Payer: Self-pay

## 2018-09-07 LAB — NOVEL CORONAVIRUS, NAA: SARS-CoV-2, NAA: NOT DETECTED

## 2018-09-09 ENCOUNTER — Other Ambulatory Visit: Payer: Self-pay

## 2018-09-09 ENCOUNTER — Ambulatory Visit (INDEPENDENT_AMBULATORY_CARE_PROVIDER_SITE_OTHER): Payer: 59

## 2018-09-09 DIAGNOSIS — Z23 Encounter for immunization: Secondary | ICD-10-CM

## 2018-11-02 ENCOUNTER — Other Ambulatory Visit: Payer: Self-pay | Admitting: Internal Medicine

## 2018-12-02 ENCOUNTER — Other Ambulatory Visit: Payer: Self-pay

## 2018-12-02 ENCOUNTER — Ambulatory Visit (INDEPENDENT_AMBULATORY_CARE_PROVIDER_SITE_OTHER): Payer: 59

## 2018-12-02 DIAGNOSIS — Z23 Encounter for immunization: Secondary | ICD-10-CM | POA: Diagnosis not present

## 2019-01-19 ENCOUNTER — Other Ambulatory Visit: Payer: Self-pay

## 2019-01-19 ENCOUNTER — Encounter: Payer: Self-pay | Admitting: Emergency Medicine

## 2019-01-19 ENCOUNTER — Ambulatory Visit
Admission: EM | Admit: 2019-01-19 | Discharge: 2019-01-19 | Disposition: A | Discharge status: Home / Self Care | Payer: 59 | Attending: Family Medicine | Admitting: Family Medicine

## 2019-01-19 DIAGNOSIS — R1033 Periumbilical pain: Secondary | ICD-10-CM | POA: Diagnosis not present

## 2019-01-19 LAB — COMPREHENSIVE METABOLIC PANEL
ALT: 39 U/L (ref 0–44)
AST: 23 U/L (ref 15–41)
Albumin: 4.1 g/dL (ref 3.5–5.0)
Alkaline Phosphatase: 72 U/L (ref 38–126)
Anion gap: 7 (ref 5–15)
BUN: 16 mg/dL (ref 6–20)
CO2: 25 mmol/L (ref 22–32)
Calcium: 9.1 mg/dL (ref 8.9–10.3)
Chloride: 106 mmol/L (ref 98–111)
Creatinine, Ser: 0.86 mg/dL (ref 0.61–1.24)
GFR calc Af Amer: >60 mL/min (ref >60)
GFR calc non Af Amer: >60 mL/min (ref >60)
Glucose, Bld: 133 mg/dL — ABNORMAL HIGH (ref 70–99)
Potassium: 3.9 mmol/L (ref 3.5–5.1)
Sodium: 138 mmol/L (ref 135–145)
Total Bilirubin: 0.7 mg/dL (ref 0.3–1.2)
Total Protein: 7.6 g/dL (ref 6.5–8.1)

## 2019-01-19 LAB — CBC WITH DIFFERENTIAL/PLATELET
Abs Immature Granulocytes: 0.02 10*3/uL (ref 0.00–0.07)
Basophils Absolute: 0 10*3/uL (ref 0.0–0.1)
Basophils Relative: 0 %
Eosinophils Absolute: 0.1 10*3/uL (ref 0.0–0.5)
Eosinophils Relative: 2 %
HCT: 46.5 % (ref 39.0–52.0)
Hemoglobin: 15.2 g/dL (ref 13.0–17.0)
Immature Granulocytes: 0 %
Lymphocytes Relative: 26 %
Lymphs Abs: 1.8 10*3/uL (ref 0.7–4.0)
MCH: 26.6 pg (ref 26.0–34.0)
MCHC: 32.7 g/dL (ref 30.0–36.0)
MCV: 81.4 fL (ref 80.0–100.0)
Monocytes Absolute: 0.6 10*3/uL (ref 0.1–1.0)
Monocytes Relative: 9 %
Neutro Abs: 4.4 10*3/uL (ref 1.7–7.7)
Neutrophils Relative %: 63 %
Platelets: 230 10*3/uL (ref 150–400)
RBC: 5.71 MIL/uL (ref 4.22–5.81)
RDW: 13.2 % (ref 11.5–15.5)
WBC: 7 10*3/uL (ref 4.0–10.5)
nRBC: 0 % (ref 0.0–0.2)

## 2019-01-19 MED ORDER — MELOXICAM 15 MG PO TABS: 15.0000 mg | ORAL_TABLET | Freq: Every day | ORAL | 0 refills | Status: DC | PRN

## 2019-01-19 NOTE — ED Provider Notes (Signed)
[facility]    CSN: [id number] Arrival date & time: 01/19/19  6270  History   Chief Complaint Chief Complaint  Patient presents with  . Abdominal Pain   HPI  33 year old male presents with periumbilical abdominal pain.  Patient states that his symptoms started Friday afternoon.  He reports recent heavy lifting on Monday.  He is unsure if this is a contributing factor.  Patient reports periumbilical abdominal pain.  He states that it is tender to the touch.  Worse when he bends over or any pressure is applied to the area.  No fever.  No chills.  No anorexia.  No nausea vomiting.  No constipation or diarrhea.  He otherwise feels well.  He has taken some ibuprofen without resolution.  No other reported symptoms.  No other complaints.  PMH, Surgical Hx, Family Hx, Social History reviewed and updated as below.  Past Medical History:  Diagnosis Date  . Asthma   . Hyperlipidemia    Patient Active Problem List   Diagnosis Date Noted  . Multiple environmental allergies 09/08/2016  . Mixed hyperlipidemia 03/01/2016   Past Surgical History:  Procedure Laterality Date  . NO PAST SURGERIES     Home Medications    Prior to Admission medications   Medication Sig Start Date End Date Taking? Authorizing Provider  albuterol (PROVENTIL HFA;VENTOLIN HFA) 108 (90 Base) MCG/ACT inhaler Inhale 2 puffs into the lungs every 6 (six) hours as needed for wheezing or shortness of breath. 02/13/18  Yes Glean Hess, MD  atorvastatin (LIPITOR) 20 MG tablet TAKE 1 TABLET BY MOUTH EVERY DAY 11/03/18  Yes Glean Hess, MD  cetirizine (ZYRTEC) 10 MG tablet Take 10 mg by mouth as needed for allergies.   Yes [provider]  meloxicam (MOBIC) 15 MG tablet Take 1 tablet (15 mg total) by mouth daily as needed for pain. 01/19/19   Coral Spikes, DO    Family History Family History  Problem Relation Age of Onset  . Hypertension Mother   . Hyperlipidemia Mother   . Diabetes  Father     Social History Social History   Tobacco Use  . Smoking status: Never Smoker  . Smokeless tobacco: Never Used  Substance Use Topics  . Alcohol use: Not Currently    Alcohol/week: 0.0 standard drinks    Comment: socially  . Drug use: No     Allergies   Patient has no known allergies.   Review of Systems Review of Systems  Constitutional: Negative for appetite change, chills and fever.  Gastrointestinal: Positive for abdominal pain. Negative for constipation, diarrhea, nausea and rectal pain.   Physical Exam Triage Vital Signs ED Triage Vitals  Enc Vitals Group     BP 01/19/19 0918 135/72     Pulse Rate 01/19/19 0918 (!) 108     Resp 01/19/19 0918 18     Temp 01/19/19 0918 98.2 F (36.8 C)     Temp Source 01/19/19 0918 Oral     SpO2 01/19/19 0918 99 %     Weight 01/19/19 0918 198 lb (89.8 kg)     Height 01/19/19 0918 6' (1.829 m)     Head Circumference --      Peak Flow --      Pain Score 01/19/19 0917 6     Pain Loc --      Pain Edu? --      Excl. in [place]? --    Updated Vital Signs BP 135/72 (BP Location:  Left Arm)   Pulse (!) 108   Temp 98.2 F (36.8 C) (Oral)   Resp 18   Ht 6' (1.829 m)   Wt 89.8 kg   SpO2 99%   BMI 26.85 kg/m   Visual Acuity Right Eye Distance:   Left Eye Distance:   Bilateral Distance:    Right Eye Near:   Left Eye Near:    Bilateral Near:     Physical Exam Vitals signs and nursing note reviewed.  Constitutional:      General: He is not in acute distress.    Appearance: Normal appearance. He is not ill-appearing.  Eyes:     General:        Right eye: No discharge.        Left eye: No discharge.     Conjunctiva/sclera: Conjunctivae normal.  Cardiovascular:     Rate and Rhythm: Normal rate and regular rhythm.  Pulmonary:     Effort: Pulmonary effort is normal.     Breath sounds: Normal breath sounds. No wheezing, rhonchi or rales.  Abdominal:     Palpations: Abdomen is soft.     Comments: Periumbilical  tenderness to palpation.  No other discrete areas of tenderness.  No rebound or guarding.  Neurological:     Mental Status: He is alert.  Psychiatric:        Mood and Affect: Mood normal.        Behavior: Behavior normal.    UC Treatments / Results  Labs (all labs ordered are listed, but only abnormal results are displayed) Labs Reviewed  COMPREHENSIVE METABOLIC PANEL - Abnormal; Notable for the following components:      Result Value   Glucose, Bld 133 (*)    All other components within normal limits  CBC WITH DIFFERENTIAL/PLATELET    EKG   Radiology No results found.  Procedures Procedures (including critical care time)  Medications Ordered in UC Medications - No data to display  Initial Impression / Assessment and Plan / UC Course  I have reviewed the triage vital signs and the nursing notes.  Pertinent labs & imaging results that were available during my care of the patient were reviewed by me and considered in my medical decision making (see chart for details).    33 year old male presents with periumbilical abdominal pain.  I believe that this is abdominal wall pain.  He has no clinical evidence of early acute appendicitis at this time.  White count is normal.  No anorexia, nausea, vomiting, fever, chills, malaise.  Meloxicam as needed for pain.  Advised him to go to the ER if he starts to develop worsening pain or pain in the right lower quadrant.  Final Clinical Impressions(s) / UC Diagnoses   Final diagnoses:  Acute periumbilical pain     Discharge Instructions     I believe that this is abdominal wall pain. Your labs look good. There is no elevated white blood cell count to suggest infection.  Use the medication as directed.  If pain persists and/or worsens, you need to go to the hospital because you will need imaging (CT scan).  Take care  Dr. Adriana Simas    ED Prescriptions    Medication Sig Dispense Auth. Provider   meloxicam (MOBIC) 15 MG tablet  Take 1 tablet (15 mg total) by mouth daily as needed for pain. 14 tablet Tommie Sams, DO     PDMP not reviewed this encounter.   Tommie Sams, [place] 01/19/19 1138

## 2019-01-19 NOTE — ED Triage Notes (Signed)
Patient in today c/o umbilical abdominal pain since yesterday. Patient denies urinary symptoms or changes in bowel habits. Patient has used OTC Ibuprofen without relief.

## 2019-01-19 NOTE — Discharge Instructions (Signed)
I believe that this is abdominal wall pain. Your labs look good. There is no elevated white blood cell count to suggest infection.  Use the medication as directed.  If pain persists and/or worsens, you need to go to the hospital because you will need imaging (CT scan).  Take care  Dr. Lacinda Axon

## 2019-02-17 ENCOUNTER — Encounter: Payer: 59 | Admitting: Internal Medicine

## 2019-03-17 ENCOUNTER — Encounter: Payer: 59 | Admitting: Internal Medicine

## 2019-04-01 ENCOUNTER — Other Ambulatory Visit: Payer: Self-pay

## 2019-04-01 ENCOUNTER — Encounter: Payer: Self-pay | Admitting: Internal Medicine

## 2019-04-01 ENCOUNTER — Ambulatory Visit (INDEPENDENT_AMBULATORY_CARE_PROVIDER_SITE_OTHER): Payer: BC Managed Care – PPO | Admitting: Internal Medicine

## 2019-04-01 VITALS — BP 128/74 | HR 97 | Temp 98.1°F | Ht 72.0 in | Wt 190.0 lb

## 2019-04-01 DIAGNOSIS — E0590 Thyrotoxicosis, unspecified without thyrotoxic crisis or storm: Secondary | ICD-10-CM | POA: Diagnosis not present

## 2019-04-01 DIAGNOSIS — M65341 Trigger finger, right ring finger: Secondary | ICD-10-CM | POA: Insufficient documentation

## 2019-04-01 DIAGNOSIS — E782 Mixed hyperlipidemia: Secondary | ICD-10-CM | POA: Diagnosis not present

## 2019-04-01 DIAGNOSIS — Z0000 Encounter for general adult medical examination without abnormal findings: Secondary | ICD-10-CM | POA: Diagnosis not present

## 2019-04-01 DIAGNOSIS — R7989 Other specified abnormal findings of blood chemistry: Secondary | ICD-10-CM | POA: Diagnosis not present

## 2019-04-01 LAB — POCT URINALYSIS DIPSTICK
Bilirubin, UA: NEGATIVE
Blood, UA: NEGATIVE
Glucose, UA: NEGATIVE
Ketones, UA: NEGATIVE
Leukocytes, UA: NEGATIVE
Nitrite, UA: NEGATIVE
Protein, UA: NEGATIVE
Spec Grav, UA: 1.02 (ref 1.010–1.025)
Urobilinogen, UA: 0.2 E.U./dL
pH, UA: 6 (ref 5.0–8.0)

## 2019-04-01 NOTE — Progress Notes (Signed)
Date:  04/01/2019   Name:  Lamond Glantz [facility]   DOB:  12-May-1985   MRN:  [id number]   Chief Complaint: Annual Exam Harold Hill is a 34 y.o. male who presents today for his Complete Annual Exam. He feels well. He reports exercising regularly. He reports he is sleeping fairly well. Asthma and allergies have not been a problem recently.  He has lost about 15 lbs with diet and exercise and feels well.  Immunization History  Administered Date(s) Administered  . Influenza,inj,Quad PF,6+ Mos 02/13/2018, 12/02/2018  . Tdap 09/09/2018    Hyperlipidemia This is a chronic problem. The problem is controlled. Pertinent negatives include no chest pain, myalgias or shortness of breath. Current antihyperlipidemic treatment includes statins. The current treatment provides significant improvement of lipids. There are no compliance problems.   Finger catching - almost every day he has catching of his right ring finger and has to use his other hand to straighten it.  Minimal pain, no swelling.  Lab Results  Component Value Date   CREATININE 0.86 01/19/2019   BUN 16 01/19/2019   NA 138 01/19/2019   K 3.9 01/19/2019   CL 106 01/19/2019   CO2 25 01/19/2019   Lab Results  Component Value Date   CHOL 179 08/08/2018   HDL 33 (L) 08/08/2018   LDLCALC 114 (H) 08/08/2018   TRIG 160 (H) 08/08/2018   CHOLHDL 5.4 (H) 08/08/2018   Lab Results  Component Value Date   TSH 0.492 08/08/2018   No results found for: HGBA1C   Review of Systems  Constitutional: Negative for appetite change, chills, diaphoresis, fatigue and unexpected weight change.  HENT: Negative for hearing loss, tinnitus, trouble swallowing and voice change.   Eyes: Negative for visual disturbance.  Respiratory: Negative for choking, shortness of breath and wheezing.   Cardiovascular: Negative for chest pain, palpitations and leg swelling.  Gastrointestinal: Negative for abdominal pain, blood in stool, constipation and diarrhea.    Genitourinary: Negative for difficulty urinating, dysuria and frequency.  Musculoskeletal: Positive for back pain (intermittent). Negative for arthralgias and myalgias.  Skin: Negative for color change and rash.  Allergic/Immunologic: Positive for environmental allergies.  Neurological: Negative for dizziness, syncope and headaches.  Hematological: Negative for adenopathy.  Psychiatric/Behavioral: Negative for dysphoric mood and sleep disturbance.    Patient Active Problem List   Diagnosis Date Noted  . Multiple environmental allergies 09/08/2016  . Mixed hyperlipidemia 03/01/2016    No Known Allergies  Past Surgical History:  Procedure Laterality Date  . NO PAST SURGERIES      Social History   Tobacco Use  . Smoking status: Never Smoker  . Smokeless tobacco: Never Used  Substance Use Topics  . Alcohol use: Not Currently    Alcohol/week: 0.0 standard drinks    Comment: socially  . Drug use: No     Medication list has been reviewed and updated.  Current Meds  Medication Sig  . albuterol (PROVENTIL HFA;VENTOLIN HFA) 108 (90 Base) MCG/ACT inhaler Inhale 2 puffs into the lungs every 6 (six) hours as needed for wheezing or shortness of breath.  Marland Kitchen atorvastatin (LIPITOR) 20 MG tablet TAKE 1 TABLET BY MOUTH EVERY DAY  . cetirizine (ZYRTEC) 10 MG tablet Take 10 mg by mouth as needed for allergies.    PHQ 2/9 Scores 04/01/2019 08/08/2018 02/13/2018 03/15/2017  PHQ - 2 Score 0 0 0 0  PHQ- 9 Score 1 - - -    BP Readings from Last 3 Encounters:  04/01/19 128/74  01/19/19 135/72  09/02/18 (!) 134/91    Physical Exam Vitals and nursing note reviewed.  Constitutional:      Appearance: Normal appearance. He is well-developed.  HENT:     Head: Normocephalic.     Right Ear: Tympanic membrane, ear canal and external ear normal.     Left Ear: Tympanic membrane, ear canal and external ear normal.     Nose: Nose normal.  Eyes:     Conjunctiva/sclera: Conjunctivae normal.      Pupils: Pupils are equal, round, and reactive to light.  Neck:     Thyroid: No thyromegaly.     Vascular: No carotid bruit.  Cardiovascular:     Rate and Rhythm: Normal rate and regular rhythm.     Heart sounds: Normal heart sounds.  Pulmonary:     Effort: Pulmonary effort is normal.     Breath sounds: Normal breath sounds. No wheezing.  Chest:     Breasts:        Right: No mass.        Left: No mass.  Abdominal:     General: Bowel sounds are normal.     Palpations: Abdomen is soft.     Tenderness: There is no abdominal tenderness.  Musculoskeletal:        General: Normal range of motion.       Arms:     Cervical back: Normal range of motion and neck supple.     Right lower leg: No edema.     Left lower leg: No edema.  Lymphadenopathy:     Cervical: No cervical adenopathy.  Skin:    General: Skin is warm and dry.     Capillary Refill: Capillary refill takes less than 2 seconds.  Neurological:     General: No focal deficit present.     Mental Status: He is alert and oriented to person, place, and time.     Deep Tendon Reflexes: Reflexes are normal and symmetric.  Psychiatric:        Speech: Speech normal.        Behavior: Behavior normal.        Thought Content: Thought content normal.        Judgment: Judgment normal.     Wt Readings from Last 3 Encounters:  04/01/19 190 lb (86.2 kg)  01/19/19 198 lb (89.8 kg)  09/02/18 195 lb (88.5 kg)    BP 128/74   Pulse 97   Temp 98.1 F (36.7 C) (Oral)   Ht 6' (1.829 m)   Wt 190 lb (86.2 kg)   SpO2 98%   BMI 25.77 kg/m   Assessment and Plan: 1. Annual physical exam Normal exam Continue regular exercise and healthy diet  2. Mixed hyperlipidemia Tolerating statin medication without side effects at this time Continue same therapy without change at this time. Continue healthy diet and weight loss  3. Trigger finger, right ring finger Pt is not bothered too much at this time - he will call for Ortho referral if  symptoms worsen   Partially dictated using Dragon software. Any errors are unintentional.  Bari Edward, MD [facility]  04/01/2019

## 2019-04-01 NOTE — Patient Instructions (Signed)
Trigger Finger  Trigger finger, also called stenosing tenosynovitis,  is a condition that causes a finger to get stuck in a bent position. Each finger has a tendon, which is a tough, cord-like tissue that connects muscle to bone, and each tendon passes through a tunnel of tissue called a tendon sheath. To move your finger, your tendon needs to glide freely through the sheath. Trigger finger happens when the tendon or the sheath thickens, making it difficult to move your finger. Trigger finger can affect any finger or a thumb. It may affect more than one finger. Mild cases may clear up with rest and medicine. Severe cases require more treatment. What are the causes? Trigger finger is caused by a thickened finger tendon or tendon sheath. The cause of this thickening is not known. What increases the risk? The following factors may make you more likely to develop this condition:  Doing activities that require a strong grip.  Having rheumatoid arthritis, gout, or diabetes.  Being 40-60 years old.  Being male. What are the signs or symptoms? Symptoms of this condition include:  Pain when bending or straightening your finger.  Tenderness or swelling where your finger attaches to the palm of your hand.  A lump in the palm of your hand or on the inside of your finger.  Hearing a noise like a pop or a snap when you try to straighten your finger.  Feeling a catching or locking sensation when you try to straighten your finger.  Being unable to straighten your finger. How is this diagnosed? This condition is diagnosed based on your symptoms and a physical exam. How is this treated? This condition may be treated by:  Resting your finger and avoiding activities that make symptoms worse.  Wearing a finger splint to keep your finger extended.  Taking NSAIDs, such as ibuprofen, to relieve pain and swelling.  Doing gentle exercises to stretch the finger as told by your health care provider.   Having medicine that reduces swelling and inflammation (steroids) injected into the tendon sheath. Injections may need to be repeated.  Having surgery to open the tendon sheath. This may be done if other treatments do not work and you cannot straighten your finger. You may need physical therapy after surgery. Follow these instructions at home: If you have a splint:  Wear the splint as told by your health care provider. Remove it only as told by your health care provider.  Loosen it if your fingers tingle, become numb, or turn cold and blue.  Keep it clean.  If the splint is not waterproof: ? Do not let it get wet. ? Cover it with a watertight covering when you take a bath or shower. Managing pain, stiffness, and swelling     If directed, apply heat to the affected area as often as told by your health care provider. Use the heat source that your health care provider recommends, such as a moist heat pack or a heating pad.  Place a towel between your skin and the heat source.  Leave the heat on for 20-30 minutes.  Remove the heat if your skin turns bright red. This is especially important if you are unable to feel pain, heat, or cold. You may have a greater risk of getting burned. If directed, put ice on the painful area. To do this:  If you have a removable splint, remove it as told by your health care provider.  Put ice in a plastic bag.  Place a   towel between your skin and the bag or between your splint and the bag.  Leave the ice on for 20 minutes, 2-3 times a day.  Activity  Rest your finger as told by your health care provider. Avoid activities that make the pain worse.  Return to your normal activities as told by your health care provider. Ask your health care provider what activities are safe for you.  Do exercises as told by your health care provider.  Ask your health care provider when it is safe to drive if you have a splint on your hand. General instructions   Take over-the-counter and prescription medicines only as told by your health care provider.  Keep all follow-up visits as told by your health care provider. This is important. Contact a health care provider if:  Your symptoms are not improving with home care. Summary  Trigger finger, also called stenosing tenosynovitis, causes your finger to get stuck in a bent position. This can make it difficult and painful to straighten your finger.  This condition develops when a finger tendon or tendon sheath thickens.  Treatment may include resting your finger, wearing a splint, and taking medicines.  In severe cases, surgery to open the tendon sheath may be needed. This information is not intended to replace advice given to you by your health care provider. Make sure you discuss any questions you have with your health care provider. Document Revised: 07/01/2018 Document Reviewed: 07/01/2018 Elsevier Patient Education  2020 [employer]  

## 2019-04-02 LAB — COMPREHENSIVE METABOLIC PANEL
ALT: 35 IU/L (ref 0–44)
AST: 23 IU/L (ref 0–40)
Albumin/Globulin Ratio: 1.6 (ref 1.2–2.2)
Albumin: 4.3 g/dL (ref 4.0–5.0)
Alkaline Phosphatase: 110 IU/L (ref 39–117)
BUN/Creatinine Ratio: 11 (ref 9–20)
BUN: 9 mg/dL (ref 6–20)
Bilirubin Total: 0.3 mg/dL (ref 0.0–1.2)
CO2: 22 mmol/L (ref 20–29)
Calcium: 9.8 mg/dL (ref 8.7–10.2)
Chloride: 103 mmol/L (ref 96–106)
Creatinine, Ser: 0.8 mg/dL (ref 0.76–1.27)
GFR calc Af Amer: 134 mL/min/{1.73_m2} (ref >59)
GFR calc non Af Amer: 116 mL/min/{1.73_m2} (ref >59)
Globulin, Total: 2.7 g/dL (ref 1.5–4.5)
Glucose: 109 mg/dL — ABNORMAL HIGH (ref 65–99)
Potassium: 4.3 mmol/L (ref 3.5–5.2)
Sodium: 142 mmol/L (ref 134–144)
Total Protein: 7 g/dL (ref 6.0–8.5)

## 2019-04-02 LAB — CBC WITH DIFFERENTIAL/PLATELET
Basophils Absolute: 0 10*3/uL (ref 0.0–0.2)
Basos: 0 %
EOS (ABSOLUTE): 0.1 10*3/uL (ref 0.0–0.4)
Eos: 2 %
Hematocrit: 44.9 % (ref 37.5–51.0)
Hemoglobin: 14.9 g/dL (ref 13.0–17.7)
Immature Grans (Abs): 0 10*3/uL (ref 0.0–0.1)
Immature Granulocytes: 0 %
Lymphocytes Absolute: 1.7 10*3/uL (ref 0.7–3.1)
Lymphs: 29 %
MCH: 25.9 pg — ABNORMAL LOW (ref 26.6–33.0)
MCHC: 33.2 g/dL (ref 31.5–35.7)
MCV: 78 fL — ABNORMAL LOW (ref 79–97)
Monocytes Absolute: 0.5 10*3/uL (ref 0.1–0.9)
Monocytes: 8 %
Neutrophils Absolute: 3.6 10*3/uL (ref 1.4–7.0)
Neutrophils: 61 %
Platelets: 235 10*3/uL (ref 150–450)
RBC: 5.76 x10E6/uL (ref 4.14–5.80)
RDW: 13.4 % (ref 11.6–15.4)
WBC: 5.9 10*3/uL (ref 3.4–10.8)

## 2019-04-02 LAB — LIPID PANEL
Chol/HDL Ratio: 5.4 ratio — ABNORMAL HIGH (ref 0.0–5.0)
Cholesterol, Total: 179 mg/dL (ref 100–199)
HDL: 33 mg/dL — ABNORMAL LOW (ref >39)
LDL Chol Calc (NIH): 116 mg/dL — ABNORMAL HIGH (ref 0–99)
Triglycerides: 167 mg/dL — ABNORMAL HIGH (ref 0–149)
VLDL Cholesterol Cal: 30 mg/dL (ref 5–40)

## 2019-04-02 LAB — TSH: TSH: <0.005 u[IU]/mL — ABNORMAL LOW (ref 0.450–4.500)

## 2019-04-08 LAB — SPECIMEN STATUS REPORT

## 2019-04-08 LAB — T4: T4, Total: 11.3 ug/dL (ref 4.5–12.0)

## 2019-04-08 LAB — T3: T3, Total: 228 ng/dL — ABNORMAL HIGH (ref 71–180)

## 2019-04-10 ENCOUNTER — Other Ambulatory Visit: Payer: Self-pay

## 2019-04-10 DIAGNOSIS — E0500 Thyrotoxicosis with diffuse goiter without thyrotoxic crisis or storm: Secondary | ICD-10-CM | POA: Insufficient documentation

## 2019-04-10 DIAGNOSIS — E0590 Thyrotoxicosis, unspecified without thyrotoxic crisis or storm: Secondary | ICD-10-CM | POA: Insufficient documentation

## 2019-06-04 DIAGNOSIS — R7303 Prediabetes: Secondary | ICD-10-CM | POA: Diagnosis not present

## 2019-06-04 DIAGNOSIS — R946 Abnormal results of thyroid function studies: Secondary | ICD-10-CM | POA: Diagnosis not present

## 2019-06-04 DIAGNOSIS — E0590 Thyrotoxicosis, unspecified without thyrotoxic crisis or storm: Secondary | ICD-10-CM | POA: Diagnosis not present

## 2019-06-19 DIAGNOSIS — E0500 Thyrotoxicosis with diffuse goiter without thyrotoxic crisis or storm: Secondary | ICD-10-CM | POA: Diagnosis not present

## 2019-06-19 DIAGNOSIS — R946 Abnormal results of thyroid function studies: Secondary | ICD-10-CM | POA: Diagnosis not present

## 2019-06-19 DIAGNOSIS — E0590 Thyrotoxicosis, unspecified without thyrotoxic crisis or storm: Secondary | ICD-10-CM | POA: Diagnosis not present

## 2019-09-16 DIAGNOSIS — E0500 Thyrotoxicosis with diffuse goiter without thyrotoxic crisis or storm: Secondary | ICD-10-CM | POA: Diagnosis not present

## 2019-10-24 DIAGNOSIS — R946 Abnormal results of thyroid function studies: Secondary | ICD-10-CM | POA: Diagnosis not present

## 2019-10-24 DIAGNOSIS — E0500 Thyrotoxicosis with diffuse goiter without thyrotoxic crisis or storm: Secondary | ICD-10-CM | POA: Diagnosis not present

## 2019-10-24 DIAGNOSIS — E0590 Thyrotoxicosis, unspecified without thyrotoxic crisis or storm: Secondary | ICD-10-CM | POA: Diagnosis not present

## 2019-10-24 DIAGNOSIS — R7303 Prediabetes: Secondary | ICD-10-CM | POA: Diagnosis not present

## 2019-11-21 DIAGNOSIS — E0590 Thyrotoxicosis, unspecified without thyrotoxic crisis or storm: Secondary | ICD-10-CM | POA: Diagnosis not present

## 2019-11-21 DIAGNOSIS — E0500 Thyrotoxicosis with diffuse goiter without thyrotoxic crisis or storm: Secondary | ICD-10-CM | POA: Diagnosis not present

## 2019-11-21 DIAGNOSIS — R946 Abnormal results of thyroid function studies: Secondary | ICD-10-CM | POA: Diagnosis not present

## 2019-12-19 DIAGNOSIS — E0590 Thyrotoxicosis, unspecified without thyrotoxic crisis or storm: Secondary | ICD-10-CM | POA: Diagnosis not present

## 2019-12-19 DIAGNOSIS — R946 Abnormal results of thyroid function studies: Secondary | ICD-10-CM | POA: Diagnosis not present

## 2019-12-19 DIAGNOSIS — E0500 Thyrotoxicosis with diffuse goiter without thyrotoxic crisis or storm: Secondary | ICD-10-CM | POA: Diagnosis not present

## 2020-01-16 DIAGNOSIS — E0500 Thyrotoxicosis with diffuse goiter without thyrotoxic crisis or storm: Secondary | ICD-10-CM | POA: Diagnosis not present

## 2020-01-28 ENCOUNTER — Other Ambulatory Visit: Payer: Self-pay | Admitting: Internal Medicine

## 2020-02-13 DIAGNOSIS — E0500 Thyrotoxicosis with diffuse goiter without thyrotoxic crisis or storm: Secondary | ICD-10-CM | POA: Diagnosis not present

## 2020-03-12 DIAGNOSIS — E0500 Thyrotoxicosis with diffuse goiter without thyrotoxic crisis or storm: Secondary | ICD-10-CM | POA: Diagnosis not present

## 2020-03-25 DIAGNOSIS — Z23 Encounter for immunization: Secondary | ICD-10-CM | POA: Diagnosis not present

## 2020-03-25 DIAGNOSIS — R7303 Prediabetes: Secondary | ICD-10-CM | POA: Diagnosis not present

## 2020-03-25 DIAGNOSIS — E0500 Thyrotoxicosis with diffuse goiter without thyrotoxic crisis or storm: Secondary | ICD-10-CM | POA: Diagnosis not present

## 2020-03-25 LAB — BASIC METABOLIC PANEL
BUN: 14 (ref 4–21)
CO2: 29 — AB (ref 13–22)
Chloride: 103 (ref 99–108)
Creatinine: 0.9 (ref 0.6–1.3)
Potassium: 4.1 (ref 3.4–5.3)
Sodium: 139 (ref 137–147)

## 2020-03-25 LAB — TSH: TSH: 4.93 (ref 0.41–5.90)

## 2020-03-25 LAB — VITAMIN B12: Vitamin B-12: 132

## 2020-04-06 ENCOUNTER — Encounter: Payer: BC Managed Care – PPO | Admitting: Internal Medicine

## 2020-04-08 ENCOUNTER — Encounter: Payer: Self-pay | Admitting: Internal Medicine

## 2020-04-08 ENCOUNTER — Other Ambulatory Visit: Payer: Self-pay

## 2020-04-08 ENCOUNTER — Ambulatory Visit (INDEPENDENT_AMBULATORY_CARE_PROVIDER_SITE_OTHER): Payer: BC Managed Care – PPO | Admitting: Internal Medicine

## 2020-04-08 VITALS — BP 128/78 | HR 85 | Temp 98.3°F | Ht 72.0 in | Wt 203.0 lb

## 2020-04-08 DIAGNOSIS — R739 Hyperglycemia, unspecified: Secondary | ICD-10-CM | POA: Diagnosis not present

## 2020-04-08 DIAGNOSIS — M62830 Muscle spasm of back: Secondary | ICD-10-CM | POA: Diagnosis not present

## 2020-04-08 DIAGNOSIS — G5710 Meralgia paresthetica, unspecified lower limb: Secondary | ICD-10-CM | POA: Diagnosis not present

## 2020-04-08 DIAGNOSIS — Z1159 Encounter for screening for other viral diseases: Secondary | ICD-10-CM

## 2020-04-08 DIAGNOSIS — E782 Mixed hyperlipidemia: Secondary | ICD-10-CM

## 2020-04-08 DIAGNOSIS — Z0000 Encounter for general adult medical examination without abnormal findings: Secondary | ICD-10-CM | POA: Diagnosis not present

## 2020-04-08 DIAGNOSIS — E0590 Thyrotoxicosis, unspecified without thyrotoxic crisis or storm: Secondary | ICD-10-CM | POA: Diagnosis not present

## 2020-04-08 MED ORDER — ATORVASTATIN CALCIUM 20 MG PO TABS: 20.0000 mg | ORAL_TABLET | Freq: Every day | ORAL | 3 refills | Status: DC

## 2020-04-08 MED ORDER — METHOCARBAMOL 500 MG PO TABS: 500.0000 mg | ORAL_TABLET | Freq: Every evening | ORAL | 0 refills | Status: DC | PRN

## 2020-04-08 NOTE — Patient Instructions (Signed)
Try back stretching exercises and core strengthening exercises.  Okay to continue with chiropractic adjustments.  Take a B12 supplement over the counter

## 2020-04-08 NOTE — Progress Notes (Signed)
Date:  04/08/2020   Name:  Harold Hill   DOB:  07-04-1985   MRN:  [id number]   Chief Complaint: Annual Exam  Harold Hill is a 35 y.o. male who presents today for his Complete Annual Exam. He feels well. He reports exercising walking X7 days a week.Marland Kitchen He reports he is sleeping well. His hyperthyroidism is being controlled.  He has gained a few pounds.  He has numbness in both anterior thighs that comes and goes.  He also has intermittent back pain - both lumbar and thoracic.   Immunization History  Administered Date(s) Administered  . Influenza,inj,Quad PF,6+ Mos 02/13/2018, 12/02/2018, 03/25/2020  . Moderna Sars-Covid-2 Vaccination 05/13/2019, 06/10/2019  . Tdap 09/09/2018    Thyroid Problem Presents for follow-up (followed by Endo for hyperthyroidism) visit. Patient reports no constipation, diaphoresis, diarrhea, fatigue or palpitations. The symptoms have been stable (now on methimazole).  Back Pain This is a recurrent problem. The pain is present in the lumbar spine and thoracic spine. The pain does not radiate. The pain is moderate. Exacerbated by: comes and goes, unrelated to activity. Associated symptoms include numbness (anterior thighs - no correlation). Pertinent negatives include no abdominal pain, chest pain, dysuria or headaches.  Thigh numbness - comes and goes, mostly at home.  No pain or leg weakness.  B12 was borderline low at Endo. The numbness is unrelated to episodes of low back pain.   Lab Results  Component Value Date   CREATININE 0.9 03/25/2020   BUN 14 03/25/2020   NA 139 03/25/2020   K 4.1 03/25/2020   CL 103 03/25/2020   CO2 29 (A) 03/25/2020   Lab Results  Component Value Date   CHOL 179 04/01/2019   HDL 33 (L) 04/01/2019   LDLCALC 116 (H) 04/01/2019   TRIG 167 (H) 04/01/2019   CHOLHDL 5.4 (H) 04/01/2019   Lab Results  Component Value Date   TSH 4.93 03/25/2020   No results found for: HGBA1C Lab Results  Component Value Date   WBC  5.9 04/01/2019   HGB 14.9 04/01/2019   HCT 44.9 04/01/2019   MCV 78 (L) 04/01/2019   PLT 235 04/01/2019   Lab Results  Component Value Date   ALT 35 04/01/2019   AST 23 04/01/2019   ALKPHOS 110 04/01/2019   BILITOT 0.3 04/01/2019     Review of Systems  Constitutional: Negative for appetite change, chills, diaphoresis, fatigue and unexpected weight change.  HENT: Negative for hearing loss, tinnitus, trouble swallowing and voice change.   Eyes: Negative for visual disturbance.  Respiratory: Negative for choking, shortness of breath and wheezing.   Cardiovascular: Negative for chest pain, palpitations and leg swelling.  Gastrointestinal: Negative for abdominal pain, blood in stool, constipation and diarrhea.  Genitourinary: Negative for difficulty urinating, dysuria and frequency.  Musculoskeletal: Positive for back pain. Negative for arthralgias and myalgias.  Skin: Negative for color change and rash.  Neurological: Positive for numbness (anterior thighs - no correlation). Negative for dizziness, focal weakness, syncope and headaches.  Hematological: Negative for adenopathy.  Psychiatric/Behavioral: Negative for dysphoric mood and sleep disturbance.    Patient Active Problem List   Diagnosis Date Noted  . Hyperthyroidism 04/10/2019  . Trigger finger, right ring finger 04/01/2019  . Multiple environmental allergies 09/08/2016  . Mixed hyperlipidemia 03/01/2016    No Known Allergies  Past Surgical History:  Procedure Laterality Date  . NO PAST SURGERIES      Social History   Tobacco Use  .  Smoking status: Never Smoker  . Smokeless tobacco: Never Used  Vaping Use  . Vaping Use: Never used  Substance Use Topics  . Alcohol use: Not Currently    Alcohol/week: 0.0 standard drinks    Comment: socially  . Drug use: No     Medication list has been reviewed and updated.  Current Meds  Medication Sig  . albuterol (PROVENTIL HFA;VENTOLIN HFA) 108 (90 Base) MCG/ACT  inhaler Inhale 2 puffs into the lungs every 6 (six) hours as needed for wheezing or shortness of breath.  Marland Kitchen atorvastatin (LIPITOR) 20 MG tablet TAKE 1 TABLET BY MOUTH EVERY DAY  . cetirizine (ZYRTEC) 10 MG tablet Take 10 mg by mouth as needed for allergies.  . methimazole (TAPAZOLE) 5 MG tablet Take 15 mg by mouth daily.    PHQ 2/9 Scores 04/08/2020 04/01/2019 08/08/2018 02/13/2018  PHQ - 2 Score 0 0 0 0  PHQ- 9 Score 0 1 - -    GAD 7 : Generalized Anxiety Score 04/08/2020  Nervous, Anxious, on Edge 0  Control/stop worrying 0  Worry too much - different things 0  Trouble relaxing 0  Restless 0  Easily annoyed or irritable 0  Afraid - awful might happen 0  Total GAD 7 Score 0    BP Readings from Last 3 Encounters:  04/08/20 128/78  04/01/19 128/74  01/19/19 135/72    Physical Exam Vitals and nursing note reviewed.  Constitutional:      Appearance: Normal appearance. He is well-developed.  HENT:     Head: Normocephalic.     Right Ear: Tympanic membrane, ear canal and external ear normal.     Left Ear: Tympanic membrane, ear canal and external ear normal.     Nose: Nose normal.  Eyes:     Conjunctiva/sclera: Conjunctivae normal.     Pupils: Pupils are equal, round, and reactive to light.  Neck:     Thyroid: No thyromegaly.     Vascular: No carotid bruit.  Cardiovascular:     Rate and Rhythm: Normal rate and regular rhythm.     Heart sounds: Normal heart sounds.  Pulmonary:     Effort: Pulmonary effort is normal.     Breath sounds: Normal breath sounds. No wheezing.  Chest:  Breasts:     Right: No mass.     Left: No mass.    Abdominal:     General: Bowel sounds are normal.     Palpations: Abdomen is soft.     Tenderness: There is no abdominal tenderness.  Musculoskeletal:        General: Normal range of motion.     Cervical back: Normal, normal range of motion and neck supple. No swelling, deformity or spasms. Normal range of motion.     Thoracic back: Normal. No  swelling, deformity or spasms.     Lumbar back: Normal. No spasms or tenderness. Negative right straight leg raise test and negative left straight leg raise test.     Right lower leg: No edema.     Left lower leg: No edema.  Lymphadenopathy:     Cervical: No cervical adenopathy.  Skin:    General: Skin is warm and dry.     Capillary Refill: Capillary refill takes less than 2 seconds.  Neurological:     General: No focal deficit present.     Mental Status: He is alert and oriented to person, place, and time.     Sensory: Sensation is intact.     Motor:  Motor function is intact.     Deep Tendon Reflexes: Reflexes are normal and symmetric.  Psychiatric:        Attention and Perception: Attention normal.        Mood and Affect: Mood normal.        Behavior: Behavior normal.     Wt Readings from Last 3 Encounters:  04/08/20 203 lb (92.1 kg)  04/01/19 190 lb (86.2 kg)  01/19/19 198 lb (89.8 kg)    BP 128/78   Pulse 85   Temp 98.3 F (36.8 C) (Oral)   Ht 6' (1.829 m)   Wt 203 lb (92.1 kg)   SpO2 97%   BMI 27.53 kg/m   Assessment and Plan: 1. Annual physical exam Normal exam Recommend regular exercise, efforts at weight loss - CBC with Differential/Platelet - Hepatic function panel  2. Hyperthyroidism Controlled on Methimazole per Endo  3. Mixed hyperlipidemia Check labs; continue current medications - Lipid panel  4. Back muscle spasm Recommend continued chiropractic care Begin exercises for core strengthening and stretching - methocarbamol (ROBAXIN) 500 MG tablet; Take 1 tablet (500 mg total) by mouth at bedtime as needed for muscle spasms.  Dispense: 90 tablet; Refill: 0  5. Lateral cutaneous femoral nerve of thigh compression or syndrome, unspecified laterality Recommend loose-waisted clothes, avoid using a belt. Should improve with conservative measures Can take a B12 daily if desired  6. Elevated serum glucose - Hemoglobin A1c  7. Need for hepatitis C  screening test - Hepatitis C antibody   Partially dictated using Animal nutritionist. Any errors are unintentional.  Bari Edward, MD [facility]  04/08/2020

## 2020-04-09 LAB — HEMOGLOBIN A1C
Est. average glucose Bld gHb Est-mCnc: 131 mg/dL
Hgb A1c MFr Bld: 6.2 % — ABNORMAL HIGH (ref 4.8–5.6)

## 2020-04-09 LAB — LIPID PANEL
Chol/HDL Ratio: 5.3 ratio — ABNORMAL HIGH (ref 0.0–5.0)
Cholesterol, Total: 190 mg/dL (ref 100–199)
HDL: 36 mg/dL — ABNORMAL LOW (ref >39)
LDL Chol Calc (NIH): 126 mg/dL — ABNORMAL HIGH (ref 0–99)
Triglycerides: 154 mg/dL — ABNORMAL HIGH (ref 0–149)
VLDL Cholesterol Cal: 28 mg/dL (ref 5–40)

## 2020-04-09 LAB — CBC WITH DIFFERENTIAL/PLATELET
Basophils Absolute: 0 10*3/uL (ref 0.0–0.2)
Basos: 0 %
EOS (ABSOLUTE): 0.1 10*3/uL (ref 0.0–0.4)
Eos: 1 %
Hematocrit: 45.6 % (ref 37.5–51.0)
Hemoglobin: 15.1 g/dL (ref 13.0–17.7)
Immature Grans (Abs): 0 10*3/uL (ref 0.0–0.1)
Immature Granulocytes: 0 %
Lymphocytes Absolute: 1.8 10*3/uL (ref 0.7–3.1)
Lymphs: 25 %
MCH: 27.8 pg (ref 26.6–33.0)
MCHC: 33.1 g/dL (ref 31.5–35.7)
MCV: 84 fL (ref 79–97)
Monocytes Absolute: 0.4 10*3/uL (ref 0.1–0.9)
Monocytes: 6 %
Neutrophils Absolute: 4.7 10*3/uL (ref 1.4–7.0)
Neutrophils: 68 %
Platelets: 187 10*3/uL (ref 150–450)
RBC: 5.44 x10E6/uL (ref 4.14–5.80)
RDW: 13.4 % (ref 11.6–15.4)
WBC: 7 10*3/uL (ref 3.4–10.8)

## 2020-04-09 LAB — HEPATIC FUNCTION PANEL
ALT: 34 IU/L (ref 0–44)
AST: 24 IU/L (ref 0–40)
Albumin: 4.8 g/dL (ref 4.0–5.0)
Alkaline Phosphatase: 110 IU/L (ref 44–121)
Bilirubin Total: 0.5 mg/dL (ref 0.0–1.2)
Bilirubin, Direct: 0.13 mg/dL (ref 0.00–0.40)
Total Protein: 7.7 g/dL (ref 6.0–8.5)

## 2020-04-09 LAB — HEPATITIS C ANTIBODY: Hep C Virus Ab: <0.1 s/co ratio (ref 0.0–0.9)

## 2020-06-12 ENCOUNTER — Encounter: Payer: Self-pay | Admitting: Emergency Medicine

## 2020-06-12 ENCOUNTER — Ambulatory Visit
Admission: EM | Admit: 2020-06-12 | Discharge: 2020-06-12 | Disposition: A | Discharge status: Home / Self Care | Payer: 59 | Attending: Physician Assistant | Admitting: Physician Assistant

## 2020-06-12 ENCOUNTER — Other Ambulatory Visit: Payer: Self-pay

## 2020-06-12 DIAGNOSIS — M545 Low back pain, unspecified: Secondary | ICD-10-CM

## 2020-06-12 HISTORY — DX: Disorder of thyroid, unspecified: E07.9

## 2020-06-12 MED ORDER — METHYLPREDNISOLONE 4 MG PO TBPK: ORAL_TABLET | ORAL | 0 refills | Status: DC

## 2020-06-12 MED ORDER — TIZANIDINE HCL 4 MG PO TABS: 4.0000 mg | ORAL_TABLET | Freq: Three times a day (TID) | ORAL | 0 refills | Status: AC | PRN

## 2020-06-12 MED ORDER — KETOROLAC TROMETHAMINE 60 MG/2ML IM SOLN
60.0000 mg | Freq: Once | INTRAMUSCULAR | Status: AC
  Administered 2020-06-12: 60 mg via INTRAMUSCULAR

## 2020-06-12 NOTE — Discharge Instructions (Addendum)

## 2020-06-12 NOTE — ED Triage Notes (Addendum)
Patient c/o low back pain that started 6 days ago. Reports difficulty with standing, sitting and bending over. He does report moving furniture on 04/07 and states on 04/08 his back was sore. He states he drove in the car for over 13 hours that weekend.  Denies urinary symptoms.

## 2020-06-12 NOTE — ED Provider Notes (Signed)
[facility]    CSN: [id number] Arrival date & time: 06/12/20  1318      History   Chief Complaint Chief Complaint  Patient presents with  . Back Pain    HPI Harold Hill is a 35 y.o. male presenting for 6 day history of lower back pain. He says the pain is all the way across lower back.  The pain does not radiate up the back or to his lower extremities.  It is not associated with any numbness, weakness or tingling.  He describes the back pain is aching and sometimes stabbing.  Pain is worse with sitting, bending forward and extending his back.  He has tried Tylenol and ibuprofen without relief of his pain.  Patient denies any trauma to the back or injury.  He states that he was moving furniture before the pain started.  He also states that he took a long car drive for over 13 hours that weekend.  He has not had any dysuria, urinary frequency urgency.  No hematuria.  No history of renal stones.  Denies any history of back problems.  Patient did have Robaxin listed in his med list but states he does not take that medication.  He has no other complaints or concerns.  HPI  Past Medical History:  Diagnosis Date  . Asthma   . Hyperlipidemia   . Thyroid disease     Patient Active Problem List   Diagnosis Date Noted  . Hyperthyroidism 04/10/2019  . Trigger finger, right ring finger 04/01/2019  . Multiple environmental allergies 09/08/2016  . Mixed hyperlipidemia 03/01/2016    Past Surgical History:  Procedure Laterality Date  . NO PAST SURGERIES         Home Medications    Prior to Admission medications   Medication Sig Start Date End Date Taking? Authorizing Provider  albuterol (PROVENTIL HFA;VENTOLIN HFA) 108 (90 Base) MCG/ACT inhaler Inhale 2 puffs into the lungs every 6 (six) hours as needed for wheezing or shortness of breath. 02/13/18  Yes Reubin Milan, MD  atorvastatin (LIPITOR) 20 MG tablet Take 1 tablet (20 mg total) by mouth daily. 04/08/20  Yes  Reubin Milan, MD  cetirizine (ZYRTEC) 10 MG tablet Take 10 mg by mouth as needed for allergies.   Yes [provider]  methimazole (TAPAZOLE) 5 MG tablet Take 15 mg by mouth daily. 03/25/20  Yes [provider]  methylPREDNISolone (MEDROL DOSEPAK) 4 MG TBPK tablet Take 6 tabs PO today and decrease by 1 tab daily x 5 more days 06/12/20  Yes Eusebio Friendly B, PA-C  tiZANidine (ZANAFLEX) 4 MG tablet Take 1 tablet (4 mg total) by mouth every 8 (eight) hours as needed for up to 10 days for muscle spasms. 06/12/20 06/22/20 Yes Shirlee Latch, PA-C    Family History Family History  Problem Relation Age of Onset  . Hypertension Mother   . Hyperlipidemia Mother   . Hyperthyroidism Mother   . Diabetes Father   . Hyperthyroidism Sister     Social History Social History   Tobacco Use  . Smoking status: Never Smoker  . Smokeless tobacco: Never Used  Vaping Use  . Vaping Use: Never used  Substance Use Topics  . Alcohol use: Not Currently    Alcohol/week: 0.0 standard drinks    Comment: socially  . Drug use: No     Allergies   Patient has no known allergies.   Review of Systems Review of Systems  Constitutional: Negative  for fatigue and fever.  Gastrointestinal: Negative for abdominal pain, nausea and vomiting.  Genitourinary: Negative for dysuria, frequency and hematuria.  Musculoskeletal: Positive for back pain. Negative for arthralgias and gait problem.  Skin: Negative for rash and wound.  Neurological: Negative for weakness and numbness.     Physical Exam Triage Vital Signs ED Triage Vitals [06/12/20 1326]  Enc Vitals Group     BP      Pulse      Resp      Temp      Temp src      SpO2      Weight 200 lb (90.7 kg)     Height 6' (1.829 m)     Head Circumference      Peak Flow      Pain Score 9     Pain Loc      Pain Edu?      Excl. in [place]?    No data found.  Updated Vital Signs BP 123/83 (BP Location: Left Arm)   Pulse 88   Temp 98.6 F (37  C) (Oral)   Resp 18   Ht 6' (1.829 m)   Wt 200 lb (90.7 kg)   SpO2 100%   BMI 27.12 kg/m    Physical Exam Vitals and nursing note reviewed.  Constitutional:      General: He is not in acute distress.    Appearance: Normal appearance. He is well-developed. He is not ill-appearing or diaphoretic.  HENT:     Head: Normocephalic and atraumatic.  Eyes:     General: No scleral icterus.    Conjunctiva/sclera: Conjunctivae normal.  Cardiovascular:     Rate and Rhythm: Normal rate and regular rhythm.     Heart sounds: Normal heart sounds.  Pulmonary:     Effort: Pulmonary effort is normal. No respiratory distress.     Breath sounds: Normal breath sounds.  Abdominal:     Palpations: Abdomen is soft.     Tenderness: There is no abdominal tenderness.  Musculoskeletal:     Cervical back: Neck supple.     Lumbar back: No tenderness or bony tenderness. Decreased range of motion (decreased forward flexion and extension). Positive right straight leg raise test (increased pain in back) and positive left straight leg raise test (increased pain in back).  Skin:    General: Skin is warm and dry.  Neurological:     General: No focal deficit present.     Mental Status: He is alert. Mental status is at baseline.     Motor: No weakness.     Gait: Gait normal.  Psychiatric:        Mood and Affect: Mood normal.        Behavior: Behavior normal.      UC Treatments / Results  Labs (all labs ordered are listed, but only abnormal results are displayed) Labs Reviewed - No data to display  EKG   Radiology No results found.  Procedures Procedures (including critical care time)  Medications Ordered in UC Medications  ketorolac (TORADOL) injection 60 mg (60 mg Intramuscular Given 06/12/20 1354)    Initial Impression / Assessment and Plan / UC Course  I have reviewed the triage vital signs and the nursing notes.  Pertinent labs & imaging results that were available during my care of the  patient were reviewed by me and considered in my medical decision making (see chart for details).   35 year old male presenting for lower back pain for 6  days.  No tenderness to palpation of any area of the back.  He does have positive straight leg raise bilaterally.  He has increased pain in his back with this test.  He does not have any leg pain with a straight leg raise.  Decreased forward flexion and extension of back due to pain.  Suspect possible lumbar disc herniation.  Treating at this time with Medrol and tizanidine.  Patient given 60 mg IM ketorolac in clinic.  Advised him to follow-up with PCP or Ortho if not improving over the next 2 weeks or for worsening symptoms.  ED red flag signs and symptoms reviewed with patient.   Final Clinical Impressions(s) / UC Diagnoses   Final diagnoses:  Acute bilateral low back pain without sciatica     Discharge Instructions     BACK PAIN: Stressed avoiding painful activities . RICE (REST, ICE, COMPRESSION, ELEVATION) guidelines reviewed. May alternate ice and heat. Consider use of muscle rubs, Salonpas patches, etc. Use medications as directed including muscle relaxers if prescribed. Take anti-inflammatory medications as prescribed or OTC NSAIDs/Tylenol.  F/u with PCP in 7-10 days for reexamination, and please feel free to call or return to the urgent care at any time for any questions or concerns you may have and we will be happy to help you!   BACK PAIN RED FLAGS: If the back pain acutely worsens or there are any red flag symptoms such as numbness/tingling, leg weakness, saddle anesthesia, or loss of bowel/bladder control, go immediately to the ER. Follow up with [place] as scheduled or sooner if the pain does not begin to resolve or if it worsens before the follow up      ED Prescriptions    Medication Sig Dispense Auth. Provider   methylPREDNISolone (MEDROL DOSEPAK) 4 MG TBPK tablet Take 6 tabs PO today and decrease by 1 tab daily x 5 more days 21  tablet Eusebio Friendly B, PA-C   tiZANidine (ZANAFLEX) 4 MG tablet Take 1 tablet (4 mg total) by mouth every 8 (eight) hours as needed for up to 10 days for muscle spasms. 30 tablet Shirlee Latch, PA-C     I have reviewed the PDMP during this encounter.   Shirlee Latch, PA-C 06/12/20 [phone]

## 2020-07-07 IMAGING — CR DG THORACIC SPINE 2V
5 series · 5 of 5 positions shown · non-contrast
Comparison: Chest x-ray of 08/13/2014

CLINICAL DATA: Fever 1 week ago with severe mid upper back pain

EXAM:
THORACIC SPINE 2 VIEWS

[t-spine ap (1 of 2)]
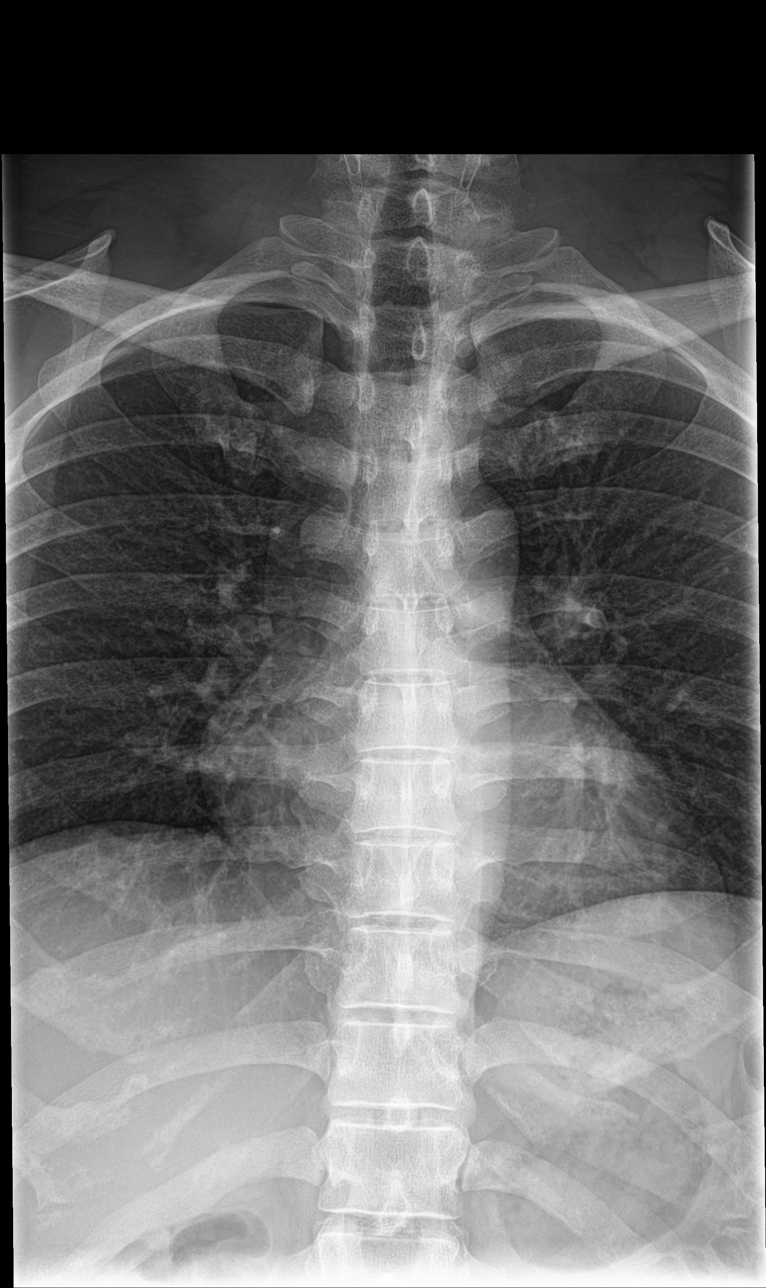

[t-spine lat (1 of 2)]
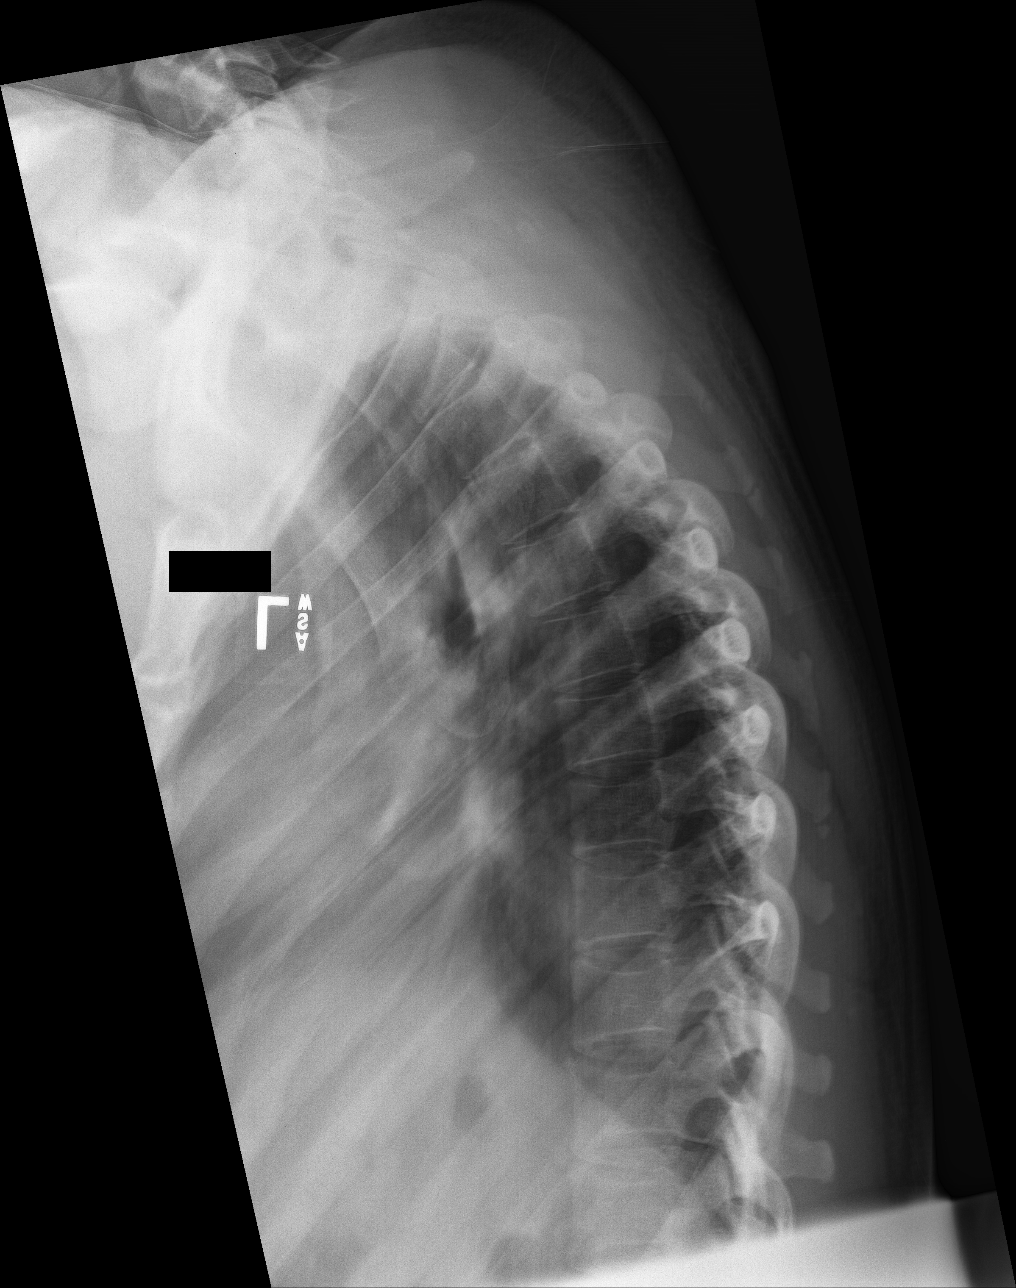

[t-spine swimmers]
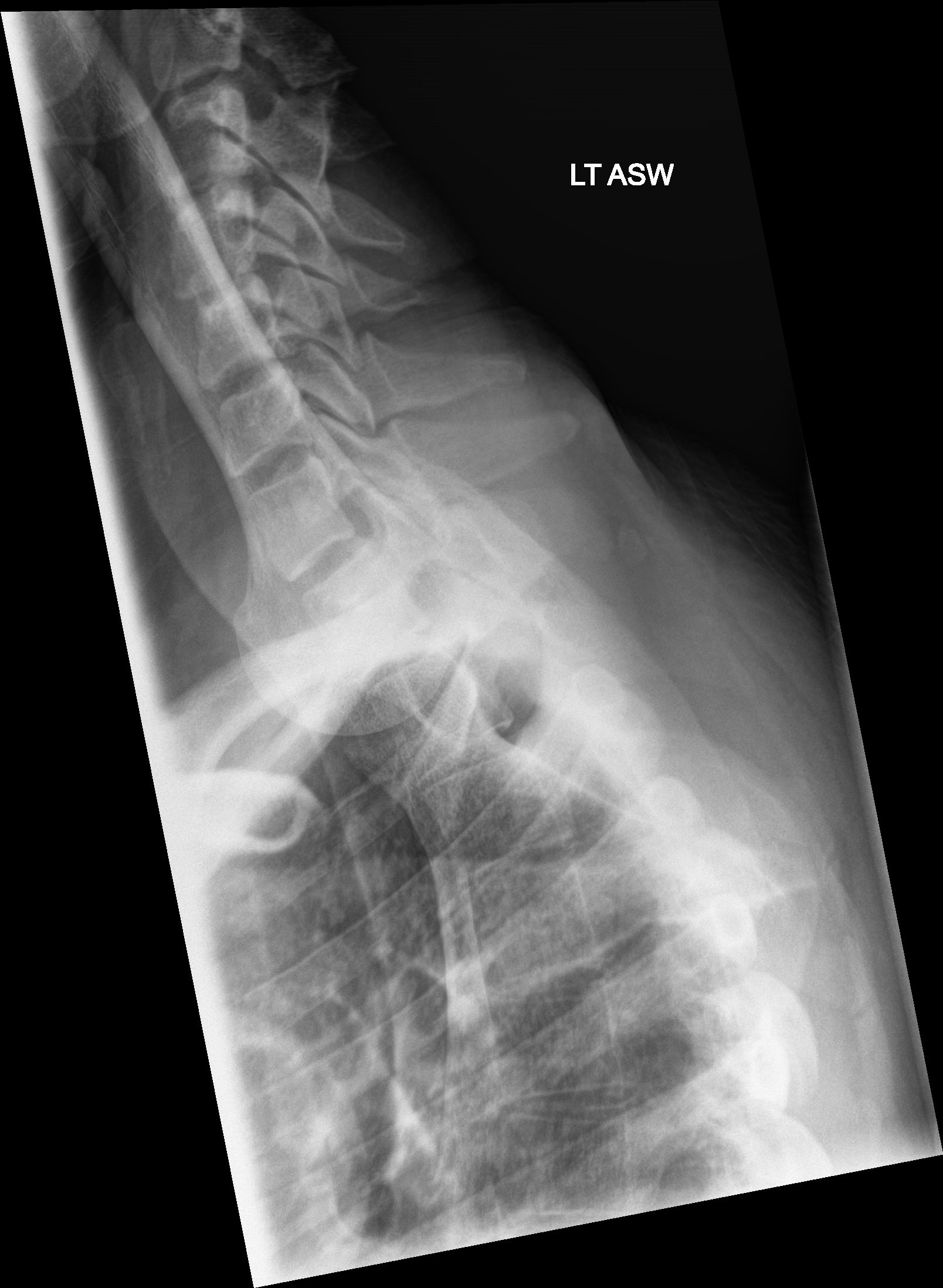

[t-spine ap (2 of 2)]
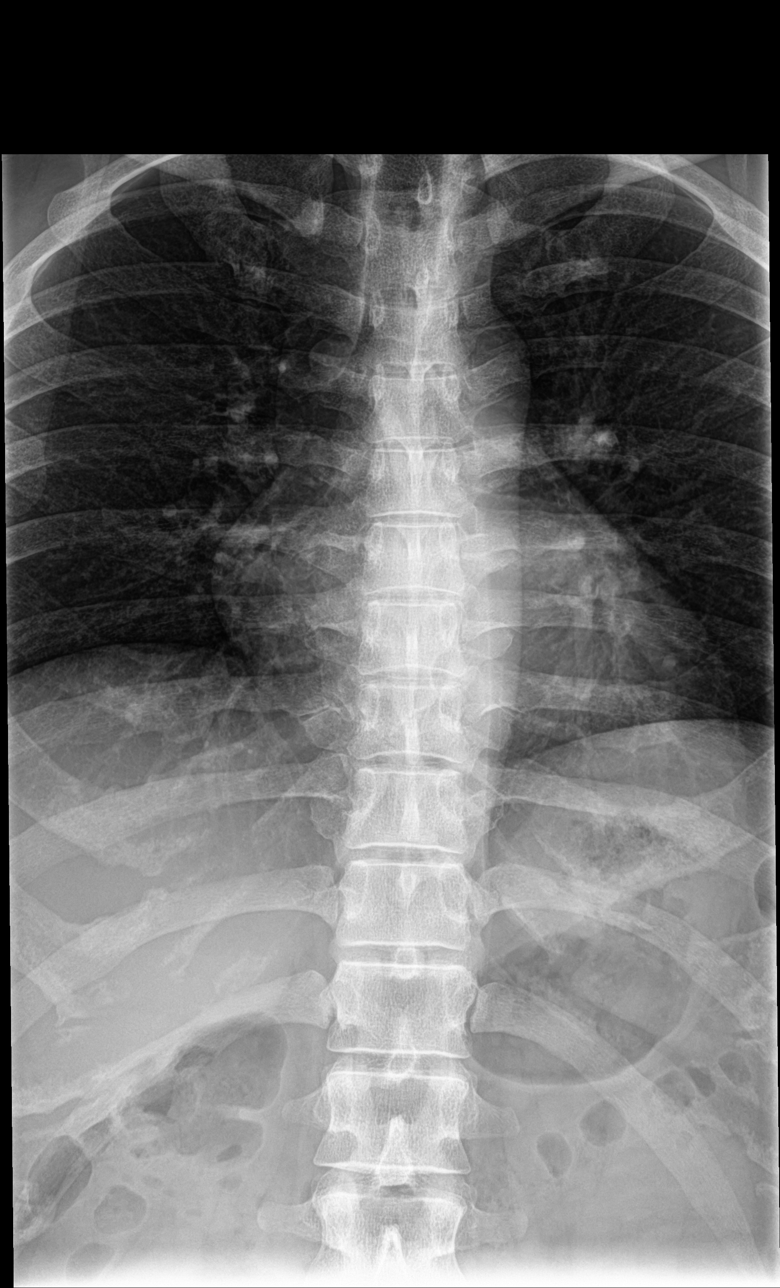

[t-spine lat (2 of 2)]
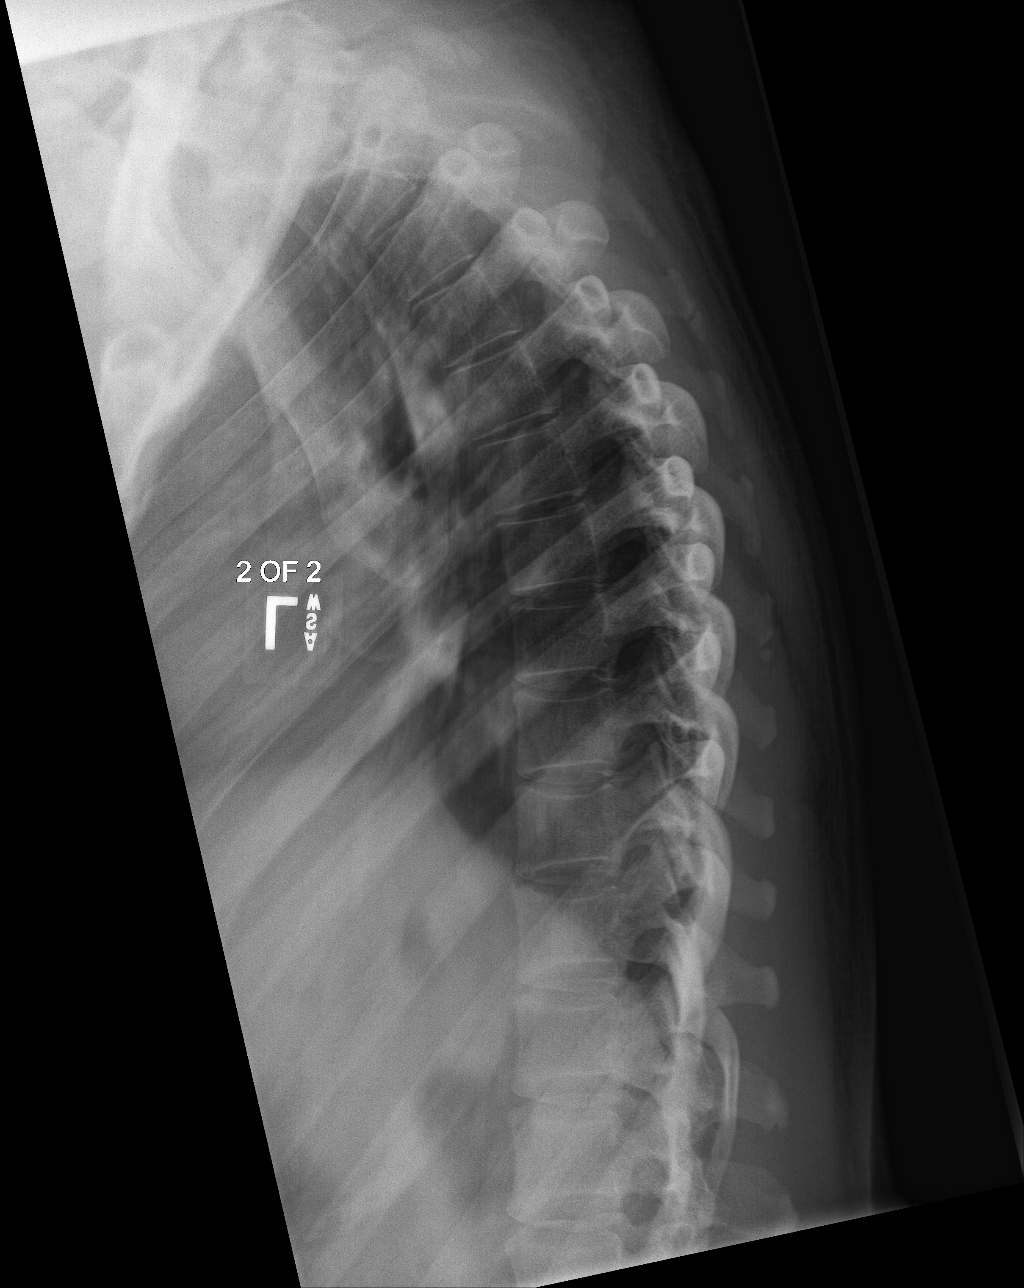

[5 of 5 positions shown; findings below may reference images not displayed]

FINDINGS: The thoracic vertebrae are in normal alignment. Intervertebral disc
spaces appear normal. No compression deformity is seen. No prominent
paravertebral soft tissue is noted.
IMPRESSION: Negative.

## 2020-10-01 DIAGNOSIS — E0500 Thyrotoxicosis with diffuse goiter without thyrotoxic crisis or storm: Secondary | ICD-10-CM | POA: Diagnosis not present

## 2020-11-26 DIAGNOSIS — E0500 Thyrotoxicosis with diffuse goiter without thyrotoxic crisis or storm: Secondary | ICD-10-CM | POA: Diagnosis not present

## 2021-01-13 ENCOUNTER — Encounter: Payer: Self-pay | Admitting: Licensed Clinical Social Worker

## 2021-01-13 ENCOUNTER — Other Ambulatory Visit: Payer: Self-pay

## 2021-01-13 ENCOUNTER — Ambulatory Visit
Admission: EM | Admit: 2021-01-13 | Discharge: 2021-01-13 | Disposition: A | Discharge status: Home / Self Care | Payer: BC Managed Care – PPO | Attending: Emergency Medicine | Admitting: Emergency Medicine

## 2021-01-13 DIAGNOSIS — B349 Viral infection, unspecified: Secondary | ICD-10-CM | POA: Diagnosis not present

## 2021-01-13 DIAGNOSIS — H6502 Acute serous otitis media, left ear: Secondary | ICD-10-CM | POA: Diagnosis not present

## 2021-01-13 MED ORDER — LIDOCAINE VISCOUS HCL 2 % MT SOLN: 15.0000 mL | OROMUCOSAL | 0 refills | Status: DC | PRN

## 2021-01-13 MED ORDER — AMOXICILLIN-POT CLAVULANATE 875-125 MG PO TABS: 1.0000 | ORAL_TABLET | Freq: Two times a day (BID) | ORAL | 0 refills | Status: DC

## 2021-01-13 MED ORDER — AMOXICILLIN 500 MG PO CAPS: 500.0000 mg | ORAL_CAPSULE | Freq: Two times a day (BID) | ORAL | 0 refills | Status: AC

## 2021-01-13 NOTE — Discharge Instructions (Addendum)
I do believe your symptoms today to be viral meaning they will resolve over time and steadily get better, I believe this virus has made you more susceptible to bacteria in your left ear is now infected    Take amoxicillin twice a day for the next 10 days for your ear infection  You may gargle and spit lidocaine solution every 4 hours for temporary relief of your sore throat  Below are other suggestions that you may attempt for further relief  You can take Tylenol and/or Ibuprofen as needed for fever reduction and pain relief.   For cough: honey 1/2 to 1 teaspoon (you can dilute the honey in water or another fluid).  You can also use guaifenesin and dextromethorphan for cough. You can use a humidifier for chest congestion and cough.  If you don't have a humidifier, you can sit in the bathroom with the hot shower running.      For sore throat: try warm salt water gargles, cepacol lozenges, throat spray, warm tea or water with lemon/honey, popsicles or ice, or OTC cold relief medicine for throat discomfort.   For congestion: take a daily anti-histamine like Zyrtec, Claritin, and a oral decongestant, such as pseudoephedrine.  You can also use Flonase 1-2 sprays in each nostril daily.   It is important to stay hydrated: drink plenty of fluids (water, gatorade/powerade/pedialyte, juices, or teas) to keep your throat moisturized and help further relieve irritation/discomfort.

## 2021-01-13 NOTE — ED Provider Notes (Signed)
[facility]    CSN: [id number] Arrival date & time: 01/13/21  1018      History   Chief Complaint Chief Complaint  Patient presents with   Ear Pain    HPI Harold Hill is a 35 y.o. male.   Patient presents with chills, fevers, nasal congestion, rhinorrhea, sore throat, nonproductive cough, intermittent generalized headaches, left eye redness with discharge for 1 day.  Endorses that left ear pain began this morning.  No known sick contacts.  Tolerating food and liquids but painful swallowing.  Has not attempted treatment of symptoms.  History of asthma, hyperlipidemia and hypothyroidism.  Denies shortness of breath, wheezing, abdominal pain, nausea, vomiting, diarrhea.   Past Medical History:  Diagnosis Date   Asthma    Hyperlipidemia    Thyroid disease     Patient Active Problem List   Diagnosis Date Noted   Hyperthyroidism 04/10/2019   Trigger finger, right ring finger 04/01/2019   Multiple environmental allergies 09/08/2016   Mixed hyperlipidemia 03/01/2016    Past Surgical History:  Procedure Laterality Date   NO PAST SURGERIES         Home Medications    Prior to Admission medications   Medication Sig Start Date End Date Taking? Authorizing Provider  albuterol (PROVENTIL HFA;VENTOLIN HFA) 108 (90 Base) MCG/ACT inhaler Inhale 2 puffs into the lungs every 6 (six) hours as needed for wheezing or shortness of breath. 02/13/18  Yes Reubin Milan, MD  atorvastatin (LIPITOR) 20 MG tablet Take 1 tablet (20 mg total) by mouth daily. 04/08/20  Yes Reubin Milan, MD  cetirizine (ZYRTEC) 10 MG tablet Take 10 mg by mouth as needed for allergies.   Yes [provider]  methimazole (TAPAZOLE) 5 MG tablet Take 15 mg by mouth daily. 03/25/20  Yes [provider]  methylPREDNISolone (MEDROL DOSEPAK) 4 MG TBPK tablet Take 6 tabs PO today and decrease by 1 tab daily x 5 more days 06/12/20   Shirlee Latch, PA-C    Family History Family  History  Problem Relation Age of Onset   Hypertension Mother    Hyperlipidemia Mother    Hyperthyroidism Mother    Diabetes Father    Hyperthyroidism Sister     Social History Social History   Tobacco Use   Smoking status: Never   Smokeless tobacco: Never  Vaping Use   Vaping Use: Never used  Substance Use Topics   Alcohol use: Not Currently    Alcohol/week: 0.0 standard drinks    Comment: socially   Drug use: No     Allergies   Patient has no known allergies.   Review of Systems Review of Systems  Constitutional:  Positive for chills and fever. Negative for activity change, appetite change, diaphoresis, fatigue and unexpected weight change.  HENT:  Positive for congestion, ear pain, rhinorrhea and sore throat. Negative for dental problem, drooling, ear discharge, facial swelling, hearing loss, mouth sores, nosebleeds, postnasal drip, sinus pressure, sinus pain, sneezing, tinnitus, trouble swallowing and voice change.   Eyes:  Positive for discharge and redness. Negative for photophobia, pain, itching and visual disturbance.  Respiratory:  Positive for cough. Negative for apnea, choking, chest tightness, shortness of breath, wheezing and stridor.   Cardiovascular: Negative.   Gastrointestinal: Negative.   Skin: Negative.   Neurological:  Positive for headaches. Negative for dizziness, tremors, seizures, syncope, facial asymmetry, speech difficulty, weakness, light-headedness and numbness.    Physical Exam Triage Vital Signs ED Triage Vitals  Enc  Vitals Group     BP 01/13/21 1104 (!) 128/92     Pulse Rate 01/13/21 1104 92     Resp 01/13/21 1104 16     Temp 01/13/21 1104 98.4 F (36.9 C)     Temp Source 01/13/21 1100 Oral     SpO2 01/13/21 1104 100 %     Weight 01/13/21 1100 199 lb 15.3 oz (90.7 kg)     Height 01/13/21 1100 6' (1.829 m)     Head Circumference --      Peak Flow --      Pain Score 01/13/21 1100 7     Pain Loc --      Pain Edu? --      Excl. in  [place]? --    No data found.  Updated Vital Signs BP (!) 128/92 (BP Location: Left Arm)   Pulse 92   Temp 98.4 F (36.9 C) (Oral)   Resp 16   Ht 6' (1.829 m)   Wt 199 lb 15.3 oz (90.7 kg)   SpO2 100%   BMI 27.12 kg/m   Visual Acuity Right Eye Distance:   Left Eye Distance:   Bilateral Distance:    Right Eye Near:   Left Eye Near:    Bilateral Near:     Physical Exam Constitutional:      Appearance: Normal appearance. He is normal weight.  HENT:     Head: Normocephalic.     Right Ear: Hearing, tympanic membrane, ear canal and external ear normal.     Left Ear: Hearing, ear canal and external ear normal. Tympanic membrane is erythematous.     Nose: Congestion and rhinorrhea present.     Mouth/Throat:     Mouth: Mucous membranes are moist.     Pharynx: Posterior oropharyngeal erythema present.  Eyes:     Extraocular Movements: Extraocular movements intact.  Cardiovascular:     Rate and Rhythm: Normal rate and regular rhythm.     Pulses: Normal pulses.     Heart sounds: Normal heart sounds.  Pulmonary:     Effort: Pulmonary effort is normal.     Breath sounds: Normal breath sounds.  Musculoskeletal:     Cervical back: Normal range of motion.  Lymphadenopathy:     Cervical: Cervical adenopathy present.  Skin:    General: Skin is warm and dry.  Neurological:     General: No focal deficit present.     Mental Status: He is alert and oriented to person, place, and time. Mental status is at baseline.  Psychiatric:        Mood and Affect: Mood normal.        Behavior: Behavior normal.     UC Treatments / Results  Labs (all labs ordered are listed, but only abnormal results are displayed) Labs Reviewed - No data to display  EKG   Radiology No results found.  Procedures Procedures (including critical care time)  Medications Ordered in UC Medications - No data to display  Initial Impression / Assessment and Plan / UC Course  I have reviewed the triage vital  signs and the nursing notes.  Pertinent labs & imaging results that were available during my care of the patient were reviewed by me and considered in my medical decision making (see chart for details).  Nonrecurrent acute serous otitis media of left ear Viral illness  1.  Amoxicillin 500 mg twice daily for 10 days 2.  Lidocaine viscous 2% 15 ml every 4 hours as  needed 3.  Over-the-counter medication for remaining symptom management 4.  Urgent care follow-up as needed Final Clinical Impressions(s) / UC Diagnoses   Final diagnoses:  None   Discharge Instructions   None    ED Prescriptions   None    PDMP not reviewed this encounter.   Hans Eden, NP 01/13/21 1202

## 2021-01-13 NOTE — ED Triage Notes (Signed)
Pt c/o left ear pain since this morning, left eye redness a few days ago with draining and some this morning. Cough and congestion.

## 2021-01-28 DIAGNOSIS — E0500 Thyrotoxicosis with diffuse goiter without thyrotoxic crisis or storm: Secondary | ICD-10-CM | POA: Diagnosis not present

## 2021-03-11 DIAGNOSIS — E0500 Thyrotoxicosis with diffuse goiter without thyrotoxic crisis or storm: Secondary | ICD-10-CM | POA: Diagnosis not present

## 2021-03-11 LAB — TSH: TSH: 3.66 (ref 0.41–5.90)

## 2021-03-25 DIAGNOSIS — D8989 Other specified disorders involving the immune mechanism, not elsewhere classified: Secondary | ICD-10-CM | POA: Diagnosis not present

## 2021-03-25 DIAGNOSIS — E0500 Thyrotoxicosis with diffuse goiter without thyrotoxic crisis or storm: Secondary | ICD-10-CM | POA: Diagnosis not present

## 2021-03-25 DIAGNOSIS — R682 Dry mouth, unspecified: Secondary | ICD-10-CM | POA: Diagnosis not present

## 2021-03-25 DIAGNOSIS — M79672 Pain in left foot: Secondary | ICD-10-CM | POA: Diagnosis not present

## 2021-03-25 LAB — BASIC METABOLIC PANEL
BUN: 11 (ref 4–21)
CO2: 28 — AB (ref 13–22)
Chloride: 107 (ref 99–108)
Creatinine: 1 (ref 0.6–1.3)
Glucose: 95
Potassium: 3.6 (ref 3.4–5.3)
Sodium: 140 (ref 137–147)

## 2021-03-25 LAB — HEPATIC FUNCTION PANEL
ALT: 38 (ref 10–40)
AST: 26 (ref 14–40)
Alkaline Phosphatase: 76 (ref 25–125)
Bilirubin, Total: 0.5

## 2021-03-25 LAB — COMPREHENSIVE METABOLIC PANEL: GFR calc non Af Amer: 99

## 2021-04-11 ENCOUNTER — Ambulatory Visit (INDEPENDENT_AMBULATORY_CARE_PROVIDER_SITE_OTHER): Payer: BC Managed Care – PPO | Admitting: Internal Medicine

## 2021-04-11 ENCOUNTER — Encounter: Payer: Self-pay | Admitting: Internal Medicine

## 2021-04-11 ENCOUNTER — Other Ambulatory Visit: Payer: Self-pay

## 2021-04-11 VITALS — BP 106/88 | HR 84 | Ht 72.0 in | Wt 192.0 lb

## 2021-04-11 DIAGNOSIS — M79672 Pain in left foot: Secondary | ICD-10-CM

## 2021-04-11 DIAGNOSIS — E782 Mixed hyperlipidemia: Secondary | ICD-10-CM

## 2021-04-11 DIAGNOSIS — Z23 Encounter for immunization: Secondary | ICD-10-CM | POA: Diagnosis not present

## 2021-04-11 DIAGNOSIS — Z0000 Encounter for general adult medical examination without abnormal findings: Secondary | ICD-10-CM | POA: Diagnosis not present

## 2021-04-11 DIAGNOSIS — G8929 Other chronic pain: Secondary | ICD-10-CM

## 2021-04-11 DIAGNOSIS — E0590 Thyrotoxicosis, unspecified without thyrotoxic crisis or storm: Secondary | ICD-10-CM | POA: Diagnosis not present

## 2021-04-11 DIAGNOSIS — G5602 Carpal tunnel syndrome, left upper limb: Secondary | ICD-10-CM

## 2021-04-11 NOTE — Patient Instructions (Signed)
Wear a cock up wrist splint while sleeping.

## 2021-04-11 NOTE — Progress Notes (Signed)
Date:  04/11/2021   Name:  Harold Hill [facility]   DOB:  1985-05-19   MRN:  [id number]   Chief Complaint: Annual Exam Harold Hill is a 36 y.o. male who presents today for his Complete Annual Exam. He feels well. He reports exercising walking 2-3 days a week . He reports he is sleeping well.   Colonoscopy: not due  Immunization History  Administered Date(s) Administered   Influenza,inj,Quad PF,6+ Mos 02/13/2018, 12/02/2018, 03/25/2020, 04/11/2021   Moderna Sars-Covid-2 Vaccination 05/13/2019, 06/10/2019   Tdap 09/09/2018    No results found for: PSA1, PSA   Hyperlipidemia This is a chronic problem. The problem is controlled. Pertinent negatives include no chest pain, myalgias or shortness of breath. Current antihyperlipidemic treatment includes statins. The current treatment provides significant improvement of lipids.  Thyroid Problem Presents for follow-up visit. Patient reports no anxiety, constipation, diaphoresis, diarrhea, fatigue or palpitations. The symptoms have been stable. His past medical history is significant for hyperlipidemia.   Lab Results  Component Value Date   NA 140 03/25/2021   K 3.6 03/25/2021   CO2 28 (A) 03/25/2021   GLUCOSE 109 (H) 04/01/2019   BUN 11 03/25/2021   CREATININE 1.0 03/25/2021   CALCIUM 9.8 04/01/2019   GFRNONAA 99 03/25/2021   Lab Results  Component Value Date   CHOL 190 04/08/2020   HDL 36 (L) 04/08/2020   LDLCALC 126 (H) 04/08/2020   TRIG 154 (H) 04/08/2020   CHOLHDL 5.3 (H) 04/08/2020   Lab Results  Component Value Date   TSH 3.66 03/11/2021   Lab Results  Component Value Date   HGBA1C 6.2 (H) 04/08/2020   Lab Results  Component Value Date   WBC 7.0 04/08/2020   HGB 15.1 04/08/2020   HCT 45.6 04/08/2020   MCV 84 04/08/2020   PLT 187 04/08/2020   Lab Results  Component Value Date   ALT 38 03/25/2021   AST 26 03/25/2021   ALKPHOS 76 03/25/2021   BILITOT 0.5 04/08/2020   No results found for: 25OHVITD2,  25OHVITD3, VD25OH   Review of Systems  Constitutional:  Negative for appetite change, chills, diaphoresis, fatigue and unexpected weight change.  HENT:  Negative for hearing loss, tinnitus, trouble swallowing and voice change.   Eyes:  Negative for visual disturbance.  Respiratory:  Negative for choking, shortness of breath and wheezing.   Cardiovascular:  Negative for chest pain, palpitations and leg swelling.  Gastrointestinal:  Negative for abdominal pain, blood in stool, constipation and diarrhea.  Genitourinary:  Negative for difficulty urinating, dysuria and frequency.  Musculoskeletal:  Positive for arthralgias (bilateral foot/heel pain). Negative for back pain and myalgias.  Skin:  Negative for color change and rash.  Neurological:  Positive for numbness (fingers of left hand). Negative for dizziness, syncope and headaches.  Hematological:  Negative for adenopathy.  Psychiatric/Behavioral:  Negative for dysphoric mood and sleep disturbance. The patient is not nervous/anxious.    Patient Active Problem List   Diagnosis Date Noted   Hyperthyroidism 04/10/2019   Trigger finger, right ring finger 04/01/2019   Multiple environmental allergies 09/08/2016   Mixed hyperlipidemia 03/01/2016    No Known Allergies  Past Surgical History:  Procedure Laterality Date   NO PAST SURGERIES      Social History   Tobacco Use   Smoking status: Never   Smokeless tobacco: Never  Vaping Use   Vaping Use: Never used  Substance Use Topics   Alcohol use: Not Currently    Alcohol/week: 0.0  standard drinks    Comment: socially   Drug use: No     Medication list has been reviewed and updated.  Current Meds  Medication Sig   albuterol (PROVENTIL HFA;VENTOLIN HFA) 108 (90 Base) MCG/ACT inhaler Inhale 2 puffs into the lungs every 6 (six) hours as needed for wheezing or shortness of breath.   atorvastatin (LIPITOR) 20 MG tablet Take 1 tablet (20 mg total) by mouth daily.   cetirizine  (ZYRTEC) 10 MG tablet Take 10 mg by mouth as needed for allergies.   methimazole (TAPAZOLE) 5 MG tablet Take 5 mg by mouth daily.   [DISCONTINUED] lidocaine (XYLOCAINE) 2 % solution Use as directed 15 mLs in the mouth or throat every 4 (four) hours as needed for mouth pain.    PHQ 2/9 Scores 04/11/2021 04/08/2020 04/01/2019 08/08/2018  PHQ - 2 Score 0 0 0 0  PHQ- 9 Score 1 0 1 -    GAD 7 : Generalized Anxiety Score 04/11/2021 04/08/2020  Nervous, Anxious, on Edge 0 0  Control/stop worrying 0 0  Worry too much - different things 0 0  Trouble relaxing 0 0  Restless 0 0  Easily annoyed or irritable 0 0  Afraid - awful might happen 0 0  Total GAD 7 Score 0 0    BP Readings from Last 3 Encounters:  04/11/21 106/88  01/13/21 (!) 128/92  06/12/20 123/83    Physical Exam Vitals and nursing note reviewed.  Constitutional:      Appearance: Normal appearance. He is well-developed.  HENT:     Head: Normocephalic.     Right Ear: Tympanic membrane, ear canal and external ear normal.     Left Ear: Tympanic membrane, ear canal and external ear normal.     Nose: Nose normal.  Eyes:     Conjunctiva/sclera: Conjunctivae normal.     Pupils: Pupils are equal, round, and reactive to light.  Neck:     Thyroid: No thyromegaly.     Vascular: No carotid bruit.  Cardiovascular:     Rate and Rhythm: Normal rate and regular rhythm.     Heart sounds: Normal heart sounds.  Pulmonary:     Effort: Pulmonary effort is normal.     Breath sounds: Normal breath sounds. No wheezing.  Chest:  Breasts:    Right: No mass.     Left: No mass.  Abdominal:     General: Bowel sounds are normal.     Palpations: Abdomen is soft.     Tenderness: There is no abdominal tenderness.  Musculoskeletal:        General: Normal range of motion.     Right wrist: No tenderness or bony tenderness. Normal range of motion.     Left wrist: No tenderness or bony tenderness.     Cervical back: Normal range of motion and neck  supple.     Left foot: Tenderness (over left heel) present.     Comments: + Phalen's on left;  Tinels negative bilaterally  Feet:     Right foot:     Skin integrity: Skin integrity normal.     Left foot:     Skin integrity: Skin integrity normal.  Lymphadenopathy:     Cervical: No cervical adenopathy.  Skin:    General: Skin is warm and dry.  Neurological:     Mental Status: He is alert and oriented to person, place, and time.     Deep Tendon Reflexes: Reflexes are normal and symmetric.  Psychiatric:  Attention and Perception: Attention normal.        Mood and Affect: Mood normal.        Thought Content: Thought content normal.    Wt Readings from Last 3 Encounters:  04/11/21 192 lb (87.1 kg)  01/13/21 199 lb 15.3 oz (90.7 kg)  06/12/20 200 lb (90.7 kg)    BP 106/88    Pulse 84    Ht 6' (1.829 m)    Wt 192 lb (87.1 kg)    SpO2 99%    BMI 26.04 kg/m   Assessment and Plan: 1. Annual physical exam Normal exam. Continue healthy diet and exercise. Up to date on screenings and immunizations.  2. Hyperthyroidism Followed by Endocrinology. Methimazole decreased to 5 mg.  3. Mixed hyperlipidemia Tolerating statin medication without side effects at this time LDL is at goal of < 70 on current dose Continue same therapy without change at this time. - Lipid panel  4. Carpal tunnel syndrome of left wrist Recommend wrist splint while sleeping. Advil or Tylenol QHS - CBC with Differential/Platelet  5. Need for immunization against influenza - Flu Vaccine QUAD 88mo+IM (Fluarix, Fluzone & Alfiuria Quad PF)  6. Heel pain, chronic, left Suspect Plantar fasciitis Continue Advil See Podiatry as planned in March.   Partially dictated using Editor, commissioning. Any errors are unintentional.  Halina Maidens, MD [facility]  04/11/2021

## 2021-04-12 LAB — LIPID PANEL
Chol/HDL Ratio: 5.9 ratio — ABNORMAL HIGH (ref 0.0–5.0)
Cholesterol, Total: 201 mg/dL — ABNORMAL HIGH (ref 100–199)
HDL: 34 mg/dL — ABNORMAL LOW (ref >39)
LDL Chol Calc (NIH): 129 mg/dL — ABNORMAL HIGH (ref 0–99)
Triglycerides: 215 mg/dL — ABNORMAL HIGH (ref 0–149)
VLDL Cholesterol Cal: 38 mg/dL (ref 5–40)

## 2021-04-12 LAB — CBC WITH DIFFERENTIAL/PLATELET
Basophils Absolute: 0 10*3/uL (ref 0.0–0.2)
Basos: 1 %
EOS (ABSOLUTE): 0.1 10*3/uL (ref 0.0–0.4)
Eos: 1 %
Hematocrit: 46.1 % (ref 37.5–51.0)
Hemoglobin: 15.3 g/dL (ref 13.0–17.7)
Immature Grans (Abs): 0 10*3/uL (ref 0.0–0.1)
Immature Granulocytes: 0 %
Lymphocytes Absolute: 1.7 10*3/uL (ref 0.7–3.1)
Lymphs: 27 %
MCH: 27.4 pg (ref 26.6–33.0)
MCHC: 33.2 g/dL (ref 31.5–35.7)
MCV: 83 fL (ref 79–97)
Monocytes Absolute: 0.6 10*3/uL (ref 0.1–0.9)
Monocytes: 10 %
Neutrophils Absolute: 3.9 10*3/uL (ref 1.4–7.0)
Neutrophils: 61 %
Platelets: 215 10*3/uL (ref 150–450)
RBC: 5.59 x10E6/uL (ref 4.14–5.80)
RDW: 13.4 % (ref 11.6–15.4)
WBC: 6.3 10*3/uL (ref 3.4–10.8)

## 2021-07-20 DIAGNOSIS — M5432 Sciatica, left side: Secondary | ICD-10-CM | POA: Diagnosis not present

## 2021-07-20 DIAGNOSIS — R200 Anesthesia of skin: Secondary | ICD-10-CM | POA: Diagnosis not present

## 2021-07-20 DIAGNOSIS — R202 Paresthesia of skin: Secondary | ICD-10-CM | POA: Diagnosis not present

## 2021-08-09 DIAGNOSIS — M545 Low back pain, unspecified: Secondary | ICD-10-CM | POA: Diagnosis not present

## 2021-08-09 DIAGNOSIS — M5442 Lumbago with sciatica, left side: Secondary | ICD-10-CM | POA: Diagnosis not present

## 2021-08-09 DIAGNOSIS — M79606 Pain in leg, unspecified: Secondary | ICD-10-CM | POA: Diagnosis not present

## 2021-08-09 DIAGNOSIS — G8929 Other chronic pain: Secondary | ICD-10-CM | POA: Diagnosis not present

## 2021-08-23 DIAGNOSIS — M6281 Muscle weakness (generalized): Secondary | ICD-10-CM | POA: Diagnosis not present

## 2021-08-23 DIAGNOSIS — M5442 Lumbago with sciatica, left side: Secondary | ICD-10-CM | POA: Diagnosis not present

## 2021-08-23 DIAGNOSIS — G8929 Other chronic pain: Secondary | ICD-10-CM | POA: Diagnosis not present

## 2021-11-18 DIAGNOSIS — E0500 Thyrotoxicosis with diffuse goiter without thyrotoxic crisis or storm: Secondary | ICD-10-CM | POA: Diagnosis not present

## 2021-12-12 ENCOUNTER — Ambulatory Visit: Payer: BC Managed Care – PPO | Admitting: Internal Medicine

## 2021-12-12 ENCOUNTER — Encounter: Payer: Self-pay | Admitting: Internal Medicine

## 2021-12-12 VITALS — BP 120/78 | HR 94 | Ht 72.0 in | Wt 190.4 lb

## 2021-12-12 DIAGNOSIS — L509 Urticaria, unspecified: Secondary | ICD-10-CM

## 2021-12-12 DIAGNOSIS — E0590 Thyrotoxicosis, unspecified without thyrotoxic crisis or storm: Secondary | ICD-10-CM | POA: Diagnosis not present

## 2021-12-12 DIAGNOSIS — E782 Mixed hyperlipidemia: Secondary | ICD-10-CM

## 2021-12-12 MED ORDER — PREDNISONE 10 MG PO TABS: 10.0000 mg | ORAL_TABLET | ORAL | 0 refills | Status: DC

## 2021-12-12 NOTE — Progress Notes (Signed)
Date:  12/12/2021   Name:  Harold Hill [facility]   DOB:  01/15/1986   MRN:  [id number]   Chief Complaint: Rash (Itchy. Comes and goes when sleeping. X1.5 months. Taking zyrtec at bed and no rash. When he doesn't take Zyrtec the rash is large and present. Not painful. Not heavy sweater. )  Rash This is a recurrent problem. The problem is unchanged. The affected locations include the left axilla and right axilla. Rash characteristics: hives and itching. He was exposed to nothing. Pertinent negatives include no congestion, cough, facial edema, fatigue, fever, joint pain or shortness of breath. Past treatments include antihistamine. The treatment provided significant (if he takes a Zyrtec at bedtime he does not get hives) relief. His past medical history is significant for asthma.    Lab Results  Component Value Date   NA 140 03/25/2021   K 3.6 03/25/2021   CO2 28 (A) 03/25/2021   GLUCOSE 109 (H) 04/01/2019   BUN 11 03/25/2021   CREATININE 1.0 03/25/2021   CALCIUM 9.8 04/01/2019   GFRNONAA 99 03/25/2021   Lab Results  Component Value Date   CHOL 201 (H) 04/11/2021   HDL 34 (L) 04/11/2021   LDLCALC 129 (H) 04/11/2021   TRIG 215 (H) 04/11/2021   CHOLHDL 5.9 (H) 04/11/2021   Lab Results  Component Value Date   TSH 3.66 03/11/2021   Lab Results  Component Value Date   HGBA1C 6.2 (H) 04/08/2020   Lab Results  Component Value Date   WBC 6.3 04/11/2021   HGB 15.3 04/11/2021   HCT 46.1 04/11/2021   MCV 83 04/11/2021   PLT 215 04/11/2021   Lab Results  Component Value Date   ALT 38 03/25/2021   AST 26 03/25/2021   ALKPHOS 76 03/25/2021   BILITOT 0.5 04/08/2020   No results found for: "25OHVITD2", "25OHVITD3", "VD25OH"   Review of Systems  Constitutional:  Negative for chills, fatigue and fever.  HENT:  Negative for congestion and trouble swallowing.   Respiratory:  Negative for cough and shortness of breath.   Cardiovascular:  Negative for chest pain and palpitations.   Musculoskeletal:  Negative for arthralgias and joint pain.  Skin:  Positive for rash.  Neurological:  Negative for dizziness, light-headedness and headaches.  Psychiatric/Behavioral:  Negative for dysphoric mood and sleep disturbance. The patient is not nervous/anxious.     Patient Active Problem List   Diagnosis Date Noted   Hyperthyroidism 04/10/2019   Trigger finger, right ring finger 04/01/2019   Multiple environmental allergies 09/08/2016   Mixed hyperlipidemia 03/01/2016    No Known Allergies  Past Surgical History:  Procedure Laterality Date   NO PAST SURGERIES      Social History   Tobacco Use   Smoking status: Never   Smokeless tobacco: Never  Vaping Use   Vaping Use: Never used  Substance Use Topics   Alcohol use: Not Currently    Alcohol/week: 0.0 standard drinks of alcohol    Comment: socially   Drug use: No     Medication list has been reviewed and updated.  Current Meds  Medication Sig   methocarbamol (ROBAXIN) 500 MG tablet TAKE 1 TABLET (500 MG TOTAL) BY MOUTH AT BEDTIME AS NEEDED FOR MUSCLE SPASMS.       04/11/2021   10:12 AM 04/08/2020    9:01 AM  GAD 7 : Generalized Anxiety Score  Nervous, Anxious, on Edge 0 0  Control/stop worrying 0 0  Worry too much - different  things 0 0  Trouble relaxing 0 0  Restless 0 0  Easily annoyed or irritable 0 0  Afraid - awful might happen 0 0  Total GAD 7 Score 0 0       04/11/2021   10:11 AM 04/08/2020    9:01 AM 04/01/2019    8:55 AM  Depression screen PHQ 2/9  Decreased Interest 0 0 0  Down, Depressed, Hopeless 0 0 0  PHQ - 2 Score 0 0 0  Altered sleeping 0 0 0  Tired, decreased energy 1 0 1  Change in appetite 0 0 0  Feeling bad or failure about yourself  0 0 0  Trouble concentrating 0 0 0  Moving slowly or fidgety/restless 0 0 0  Suicidal thoughts 0 0 0  PHQ-9 Score 1 0 1  Difficult doing work/chores Not difficult at all  Not difficult at all    BP Readings from Last 3 Encounters:   12/12/21 120/78  04/11/21 106/88  01/13/21 (!) 128/92    Physical Exam Vitals and nursing note reviewed.  Constitutional:      General: He is not in acute distress.    Appearance: He is well-developed.  HENT:     Head: Normocephalic and atraumatic.  Cardiovascular:     Rate and Rhythm: Normal rate and regular rhythm.  Pulmonary:     Effort: Pulmonary effort is normal. No respiratory distress.     Breath sounds: No wheezing or rhonchi.  Musculoskeletal:     Cervical back: Normal range of motion.  Lymphadenopathy:     Cervical: No cervical adenopathy.  Skin:    General: Skin is warm and dry.     Findings: Erythema and rash present. Rash is macular.     Comments: Flat red lesions scattered along both sides of the trunk and onto the back  Neurological:     Mental Status: He is alert and oriented to person, place, and time.  Psychiatric:        Mood and Affect: Mood normal.        Behavior: Behavior normal.     Wt Readings from Last 3 Encounters:  12/12/21 190 lb 6.4 oz (86.4 kg)  04/11/21 192 lb (87.1 kg)  01/13/21 199 lb 15.3 oz (90.7 kg)    BP 120/78   Pulse 94   Ht 6' (1.829 m)   Wt 190 lb 6.4 oz (86.4 kg)   SpO2 96%   BMI 25.82 kg/m   Assessment and Plan: 1. Urticaria Will do some initial testing and recommend steroid taper He can continue to take Zyrtec if needed May need referral to Allergy specialist - CBC with Differential/Platelet - IgE - predniSONE (DELTASONE) 10 MG tablet; Take 1 tablet (10 mg total) by mouth as directed for 6 days. Take 6,5,4,3,2,1 then stop  Dispense: 21 tablet; Refill: 0  2. Hyperthyroidism He has not been compliant with his methimazole (taking in about once every 2 weeks) yet recent labs were normal.  3. Mixed hyperlipidemia - Lipid panel - Comprehensive metabolic panel   Partially dictated using Dragon software. Any errors are unintentional.  Halina Maidens, MD [facility]  12/12/2021

## 2021-12-14 ENCOUNTER — Other Ambulatory Visit: Payer: Self-pay

## 2021-12-14 LAB — CBC WITH DIFFERENTIAL/PLATELET
Basophils Absolute: 0 10*3/uL (ref 0.0–0.2)
Basos: 0 %
EOS (ABSOLUTE): 0 10*3/uL (ref 0.0–0.4)
Eos: 0 %
Hematocrit: 50.2 % (ref 37.5–51.0)
Hemoglobin: 16.4 g/dL (ref 13.0–17.7)
Immature Grans (Abs): 0 10*3/uL (ref 0.0–0.1)
Immature Granulocytes: 0 %
Lymphocytes Absolute: 1.5 10*3/uL (ref 0.7–3.1)
Lymphs: 13 %
MCH: 27.7 pg (ref 26.6–33.0)
MCHC: 32.7 g/dL (ref 31.5–35.7)
MCV: 85 fL (ref 79–97)
Monocytes Absolute: 0.6 10*3/uL (ref 0.1–0.9)
Monocytes: 5 %
Neutrophils Absolute: 8.9 10*3/uL — ABNORMAL HIGH (ref 1.4–7.0)
Neutrophils: 82 %
Platelets: 237 10*3/uL (ref 150–450)
RBC: 5.91 x10E6/uL — ABNORMAL HIGH (ref 4.14–5.80)
RDW: 13.4 % (ref 11.6–15.4)
WBC: 10.9 10*3/uL — ABNORMAL HIGH (ref 3.4–10.8)

## 2021-12-14 LAB — COMPREHENSIVE METABOLIC PANEL
ALT: 29 IU/L (ref 0–44)
AST: 22 IU/L (ref 0–40)
Albumin/Globulin Ratio: 1.5 (ref 1.2–2.2)
Albumin: 4.8 g/dL (ref 4.1–5.1)
Alkaline Phosphatase: 83 IU/L (ref 44–121)
BUN/Creatinine Ratio: 13 (ref 9–20)
BUN: 13 mg/dL (ref 6–20)
Bilirubin Total: 0.5 mg/dL (ref 0.0–1.2)
CO2: 24 mmol/L (ref 20–29)
Calcium: 9.7 mg/dL (ref 8.7–10.2)
Chloride: 100 mmol/L (ref 96–106)
Creatinine, Ser: 1.04 mg/dL (ref 0.76–1.27)
Globulin, Total: 3.1 g/dL (ref 1.5–4.5)
Glucose: 107 mg/dL — ABNORMAL HIGH (ref 70–99)
Potassium: 4.6 mmol/L (ref 3.5–5.2)
Sodium: 138 mmol/L (ref 134–144)
Total Protein: 7.9 g/dL (ref 6.0–8.5)
eGFR: 94 mL/min/{1.73_m2} (ref >59)

## 2021-12-14 LAB — LIPID PANEL
Chol/HDL Ratio: 5.9 ratio — ABNORMAL HIGH (ref 0.0–5.0)
Cholesterol, Total: 205 mg/dL — ABNORMAL HIGH (ref 100–199)
HDL: 35 mg/dL — ABNORMAL LOW (ref >39)
LDL Chol Calc (NIH): 133 mg/dL — ABNORMAL HIGH (ref 0–99)
Triglycerides: 203 mg/dL — ABNORMAL HIGH (ref 0–149)
VLDL Cholesterol Cal: 37 mg/dL (ref 5–40)

## 2021-12-14 LAB — IGE: IgE (Immunoglobulin E), Serum: 305 IU/mL (ref 6–495)

## 2022-04-12 ENCOUNTER — Ambulatory Visit
Admission: RE | Admit: 2022-04-12 | Discharge: 2022-04-12 | Disposition: A | Discharge status: Home / Self Care | Payer: Self-pay | Source: Ambulatory Visit | Attending: Internal Medicine | Admitting: Internal Medicine

## 2022-04-12 ENCOUNTER — Ambulatory Visit (INDEPENDENT_AMBULATORY_CARE_PROVIDER_SITE_OTHER): Payer: BC Managed Care – PPO | Admitting: Internal Medicine

## 2022-04-12 ENCOUNTER — Encounter: Payer: Self-pay | Admitting: Internal Medicine

## 2022-04-12 ENCOUNTER — Telehealth: Payer: Self-pay | Admitting: Internal Medicine

## 2022-04-12 VITALS — BP 112/78 | HR 91 | Ht 72.0 in | Wt 189.0 lb

## 2022-04-12 DIAGNOSIS — Z0000 Encounter for general adult medical examination without abnormal findings: Secondary | ICD-10-CM | POA: Diagnosis not present

## 2022-04-12 DIAGNOSIS — E0500 Thyrotoxicosis with diffuse goiter without thyrotoxic crisis or storm: Secondary | ICD-10-CM

## 2022-04-12 DIAGNOSIS — Z9109 Other allergy status, other than to drugs and biological substances: Secondary | ICD-10-CM | POA: Diagnosis not present

## 2022-04-12 DIAGNOSIS — R7303 Prediabetes: Secondary | ICD-10-CM | POA: Insufficient documentation

## 2022-04-12 DIAGNOSIS — L509 Urticaria, unspecified: Secondary | ICD-10-CM | POA: Diagnosis not present

## 2022-04-12 DIAGNOSIS — E782 Mixed hyperlipidemia: Secondary | ICD-10-CM | POA: Insufficient documentation

## 2022-04-12 MED ORDER — ATORVASTATIN CALCIUM 20 MG PO TABS: 20.0000 mg | ORAL_TABLET | Freq: Every day | ORAL | 3 refills | Status: DC

## 2022-04-12 MED ORDER — ALBUTEROL SULFATE HFA 108 (90 BASE) MCG/ACT IN AERS: 2.0000 | INHALATION_SPRAY | Freq: Four times a day (QID) | RESPIRATORY_TRACT | 2 refills | Status: AC | PRN

## 2022-04-12 NOTE — Assessment & Plan Note (Addendum)
Tolerating statin medication without side effects but not taking every day - 3 times per week At patient request will order Coronary calcium scoring. LDL is  Lab Results  Component Value Date   LDLCALC 133 (H) 12/12/2021

## 2022-04-12 NOTE — Assessment & Plan Note (Addendum)
No clear triggers but having hives, some wheezing relieved by Albuterol MDI and Zyrtec Never seen by Allergy specialist. Recommend continuing Zyrtec

## 2022-04-12 NOTE — Progress Notes (Signed)
Date:  04/12/2022   Name:  Shotaro Lewy [facility]   DOB:  February 15, 1986   MRN:  [id number]   Chief Complaint: Annual Exam Harold Hill is a 37 y.o. male who presents today for his Complete Annual Exam. He feels well. He reports exercising - some. He reports he is sleeping well.   Colonoscopy: not due  Immunization History  Administered Date(s) Administered   Influenza,inj,Quad PF,6+ Mos 02/13/2018, 12/02/2018, 03/25/2020, 04/11/2021   Moderna Sars-Covid-2 Vaccination 05/13/2019, 06/10/2019   Tdap 09/09/2018   Health Maintenance Due  Topic Date Due   HIV Screening  Never done   COVID-19 Vaccine (3 - 2023-24 season) 10/28/2021    No results found for: "PSA1", "PSA"   Hyperlipidemia This is a chronic problem. Recent lipid tests were reviewed and are high. He has no history of chronic renal disease, diabetes or hypothyroidism. Pertinent negatives include no chest pain, myalgias or shortness of breath. Current antihyperlipidemic treatment includes statins (taking about 3 times per week). The current treatment provides moderate improvement of lipids. Risk factors for coronary artery disease include family history (he wants to have cardiac testing due to family hx).  Diabetes He presents for his follow-up diabetic visit. Diabetes type: prediabetes. Pertinent negatives for hypoglycemia include no dizziness, headaches or nervousness/anxiousness. Pertinent negatives for diabetes include no chest pain and no fatigue.  Hives - he is having recurrent hives - arise during sleep - several days per week over the past few months.  Zyrtec has been effective to prevent hives but he wants to know the cause.  He also has mild wheezing at times relieved by albuterol.  He uses this about once a week.  He has never had allergy testing.  He has a normal diet, no pets, no feather pillows.  He has new job where he is exposed to lead but the hives started prior.  Lab Results  Component Value Date   NA 138  12/12/2021   K 4.6 12/12/2021   CO2 24 12/12/2021   GLUCOSE 107 (H) 12/12/2021   BUN 13 12/12/2021   CREATININE 1.04 12/12/2021   CALCIUM 9.7 12/12/2021   EGFR 94 12/12/2021   GFRNONAA 99 03/25/2021   Lab Results  Component Value Date   CHOL 205 (H) 12/12/2021   HDL 35 (L) 12/12/2021   LDLCALC 133 (H) 12/12/2021   TRIG 203 (H) 12/12/2021   CHOLHDL 5.9 (H) 12/12/2021   Lab Results  Component Value Date   TSH 3.66 03/11/2021   Lab Results  Component Value Date   HGBA1C 6.2 (H) 04/08/2020   Lab Results  Component Value Date   WBC 10.9 (H) 12/12/2021   HGB 16.4 12/12/2021   HCT 50.2 12/12/2021   MCV 85 12/12/2021   PLT 237 12/12/2021   Lab Results  Component Value Date   ALT 29 12/12/2021   AST 22 12/12/2021   ALKPHOS 83 12/12/2021   BILITOT 0.5 12/12/2021   No results found for: "25OHVITD2", "25OHVITD3", "VD25OH"   Review of Systems  Constitutional:  Negative for appetite change, chills, diaphoresis, fatigue and unexpected weight change.  HENT:  Negative for hearing loss, tinnitus, trouble swallowing and voice change.   Eyes:  Negative for visual disturbance.  Respiratory:  Positive for wheezing. Negative for choking and shortness of breath.   Cardiovascular:  Negative for chest pain, palpitations and leg swelling.  Gastrointestinal:  Negative for abdominal pain, blood in stool, constipation and diarrhea.  Genitourinary:  Negative for difficulty urinating, dysuria and  frequency.  Musculoskeletal:  Negative for arthralgias, back pain and myalgias.  Skin:  Positive for rash (hives on back and thighs). Negative for color change.  Neurological:  Negative for dizziness, syncope and headaches.  Hematological:  Negative for adenopathy.  Psychiatric/Behavioral:  Negative for dysphoric mood and sleep disturbance. The patient is not nervous/anxious.     Patient Active Problem List   Diagnosis Date Noted   Prediabetes 04/12/2022   Graves' disease without crisis  04/10/2019   Trigger finger, right ring finger 04/01/2019   Multiple environmental allergies 09/08/2016   Mixed hyperlipidemia 03/01/2016    Allergies  Allergen Reactions   Prednisone Other (See Comments)    Severe Acid Reflux    Past Surgical History:  Procedure Laterality Date   NO PAST SURGERIES      Social History   Tobacco Use   Smoking status: Never   Smokeless tobacco: Never  Vaping Use   Vaping Use: Never used  Substance Use Topics   Alcohol use: Not Currently    Alcohol/week: 0.0 standard drinks of alcohol    Comment: socially   Drug use: No     Medication list has been reviewed and updated.  Current Meds  Medication Sig   cetirizine (ZYRTEC) 10 MG tablet Take 10 mg by mouth as needed for allergies.   methimazole (TAPAZOLE) 5 MG tablet Take 5 mg by mouth daily.   [DISCONTINUED] albuterol (PROVENTIL HFA;VENTOLIN HFA) 108 (90 Base) MCG/ACT inhaler Inhale 2 puffs into the lungs every 6 (six) hours as needed for wheezing or shortness of breath.   [DISCONTINUED] atorvastatin (LIPITOR) 20 MG tablet Take 1 tablet (20 mg total) by mouth daily.   [DISCONTINUED] meloxicam (MOBIC) 15 MG tablet Take 15 mg by mouth daily.   [DISCONTINUED] methocarbamol (ROBAXIN) 500 MG tablet TAKE 1 TABLET (500 MG TOTAL) BY MOUTH AT BEDTIME AS NEEDED FOR MUSCLE SPASMS.       04/12/2022   10:17 AM 12/12/2021    1:18 PM 04/11/2021   10:12 AM 04/08/2020    9:01 AM  GAD 7 : Generalized Anxiety Score  Nervous, Anxious, on Edge 1 0 0 0  Control/stop worrying 0 0 0 0  Worry too much - different things 1 0 0 0  Trouble relaxing 0 0 0 0  Restless 0 0 0 0  Easily annoyed or irritable 1 0 0 0  Afraid - awful might happen 0 0 0 0  Total GAD 7 Score 3 0 0 0  Anxiety Difficulty Not difficult at all Not difficult at all         04/12/2022   10:17 AM 12/12/2021    1:18 PM 04/11/2021   10:11 AM  Depression screen PHQ 2/9  Decreased Interest 0 0 0  Down, Depressed, Hopeless 0 0 0  PHQ - 2  Score 0 0 0  Altered sleeping 0 1 0  Tired, decreased energy 0 0 1  Change in appetite 0 0 0  Feeling bad or failure about yourself  0 0 0  Trouble concentrating 0 0 0  Moving slowly or fidgety/restless 0 0 0  Suicidal thoughts 0 0 0  PHQ-9 Score 0 1 1  Difficult doing work/chores Not difficult at all Not difficult at all Not difficult at all    BP Readings from Last 3 Encounters:  04/12/22 112/78  12/12/21 120/78  04/11/21 106/88    Physical Exam Vitals and nursing note reviewed.  Constitutional:      Appearance: Normal appearance. He  is well-developed.  HENT:     Head: Normocephalic.     Right Ear: Tympanic membrane, ear canal and external ear normal.     Left Ear: Tympanic membrane, ear canal and external ear normal.     Nose: Nose normal.  Eyes:     Conjunctiva/sclera: Conjunctivae normal.     Pupils: Pupils are equal, round, and reactive to light.  Neck:     Thyroid: No thyromegaly.     Vascular: No carotid bruit.  Cardiovascular:     Rate and Rhythm: Normal rate and regular rhythm.     Heart sounds: Normal heart sounds.  Pulmonary:     Effort: Pulmonary effort is normal.     Breath sounds: Normal breath sounds. No wheezing.  Chest:  Breasts:    Right: No mass.     Left: No mass.  Abdominal:     General: Bowel sounds are normal.     Palpations: Abdomen is soft.     Tenderness: There is no abdominal tenderness.  Musculoskeletal:        General: Normal range of motion.     Cervical back: Normal range of motion and neck supple.     Right lower leg: No edema.     Left lower leg: No edema.  Lymphadenopathy:     Cervical: No cervical adenopathy.  Skin:    General: Skin is warm and dry.     Capillary Refill: Capillary refill takes less than 2 seconds.  Neurological:     General: No focal deficit present.     Mental Status: He is alert and oriented to person, place, and time.     Deep Tendon Reflexes: Reflexes are normal and symmetric.  Psychiatric:         Attention and Perception: Attention normal.        Mood and Affect: Mood normal.        Thought Content: Thought content normal.     Wt Readings from Last 3 Encounters:  04/12/22 189 lb (85.7 kg)  12/12/21 190 lb 6.4 oz (86.4 kg)  04/11/21 192 lb (87.1 kg)    BP 112/78   Pulse 91   Ht 6' (1.829 m)   Wt 189 lb (85.7 kg)   SpO2 98%   BMI 25.63 kg/m   Assessment and Plan: Problem List Items Addressed This Visit       Endocrine   Graves' disease without crisis (Chronic)    On Methimazole with control of symptoms. Last seen by [facility] 02/2021 - appointment tomorrow TSH 2.3   10/2021        Other   Mixed hyperlipidemia (Chronic)    Tolerating statin medication without side effects but not taking every day - 3 times per week At patient request will order Coronary calcium scoring. LDL is  Lab Results  Component Value Date   LDLCALC 133 (H) 12/12/2021        Relevant Medications   atorvastatin (LIPITOR) 20 MG tablet   Other Relevant Orders   Comprehensive metabolic panel   Lipid panel   CT CARDIAC SCORING (SELF PAY ONLY)   Multiple environmental allergies (Chronic)    No clear triggers but having hives, some wheezing relieved by Albuterol MDI and Zyrtec Never seen by Allergy specialist. Recommend continuing Zyrtec      Relevant Medications   albuterol (VENTOLIN HFA) 108 (90 Base) MCG/ACT inhaler   Other Relevant Orders   Ambulatory referral to ENT   Prediabetes (Chronic)   Relevant  Orders   Hemoglobin A1c   Other Visit Diagnoses     Annual physical exam    -  Primary   Relevant Orders   CBC with Differential/Platelet   Comprehensive metabolic panel   Lipid panel   Hemoglobin A1c   Urticaria       Relevant Orders   Ambulatory referral to ENT        Partially dictated using Dragon software. Any errors are unintentional.  Halina Maidens, MD [facility]  04/12/2022

## 2022-04-12 NOTE — Telephone Encounter (Signed)
Noted  KP 

## 2022-04-12 NOTE — Assessment & Plan Note (Addendum)
On Methimazole with control of symptoms. Last seen by [facility] 02/2021 - appointment tomorrow TSH 2.3   10/2021

## 2022-04-12 NOTE — Telephone Encounter (Signed)
Copied from [place]. Topic: General - Inquiry >> Apr 12, 2022  9:18 AM Harold Hill wrote: Reason for CRM:Pt is running behind for upcoming appointment made aware of late policy. Stated he has taken off work, and it's usually not a problem to be about 5 minutes late. Again, advised pt of policy has a [id number] grace period cannot guarantee will be seen after that may have to reschedule.

## 2022-04-13 DIAGNOSIS — R7303 Prediabetes: Secondary | ICD-10-CM | POA: Diagnosis not present

## 2022-04-13 DIAGNOSIS — E0500 Thyrotoxicosis with diffuse goiter without thyrotoxic crisis or storm: Secondary | ICD-10-CM | POA: Diagnosis not present

## 2022-04-13 DIAGNOSIS — Z23 Encounter for immunization: Secondary | ICD-10-CM | POA: Diagnosis not present

## 2022-04-13 LAB — CBC WITH DIFFERENTIAL/PLATELET
Basophils Absolute: 0 10*3/uL (ref 0.0–0.2)
Basos: 1 %
EOS (ABSOLUTE): 0.3 10*3/uL (ref 0.0–0.4)
Eos: 3 %
Hematocrit: 44.6 % (ref 37.5–51.0)
Hemoglobin: 14.8 g/dL (ref 13.0–17.7)
Immature Grans (Abs): 0 10*3/uL (ref 0.0–0.1)
Immature Granulocytes: 0 %
Lymphocytes Absolute: 1.8 10*3/uL (ref 0.7–3.1)
Lymphs: 22 %
MCH: 28 pg (ref 26.6–33.0)
MCHC: 33.2 g/dL (ref 31.5–35.7)
MCV: 84 fL (ref 79–97)
Monocytes Absolute: 0.5 10*3/uL (ref 0.1–0.9)
Monocytes: 6 %
Neutrophils Absolute: 5.8 10*3/uL (ref 1.4–7.0)
Neutrophils: 68 %
Platelets: 213 10*3/uL (ref 150–450)
RBC: 5.29 x10E6/uL (ref 4.14–5.80)
RDW: 13.8 % (ref 11.6–15.4)
WBC: 8.4 10*3/uL (ref 3.4–10.8)

## 2022-04-13 LAB — LIPID PANEL
Chol/HDL Ratio: 5.4 ratio — ABNORMAL HIGH (ref 0.0–5.0)
Cholesterol, Total: 207 mg/dL — ABNORMAL HIGH (ref 100–199)
HDL: 38 mg/dL — ABNORMAL LOW (ref >39)
LDL Chol Calc (NIH): 139 mg/dL — ABNORMAL HIGH (ref 0–99)
Triglycerides: 164 mg/dL — ABNORMAL HIGH (ref 0–149)
VLDL Cholesterol Cal: 30 mg/dL (ref 5–40)

## 2022-04-13 LAB — COMPREHENSIVE METABOLIC PANEL
ALT: 34 IU/L (ref 0–44)
AST: 25 IU/L (ref 0–40)
Albumin/Globulin Ratio: 1.8 (ref 1.2–2.2)
Albumin: 4.8 g/dL (ref 4.1–5.1)
Alkaline Phosphatase: 74 IU/L (ref 44–121)
BUN/Creatinine Ratio: 15 (ref 9–20)
BUN: 15 mg/dL (ref 6–20)
Bilirubin Total: 0.5 mg/dL (ref 0.0–1.2)
CO2: 25 mmol/L (ref 20–29)
Calcium: 9.5 mg/dL (ref 8.7–10.2)
Chloride: 101 mmol/L (ref 96–106)
Creatinine, Ser: 1 mg/dL (ref 0.76–1.27)
Globulin, Total: 2.7 g/dL (ref 1.5–4.5)
Glucose: 92 mg/dL (ref 70–99)
Potassium: 4.1 mmol/L (ref 3.5–5.2)
Sodium: 139 mmol/L (ref 134–144)
Total Protein: 7.5 g/dL (ref 6.0–8.5)
eGFR: 99 mL/min/{1.73_m2} (ref >59)

## 2022-04-13 LAB — HEMOGLOBIN A1C
Est. average glucose Bld gHb Est-mCnc: 128 mg/dL
Hgb A1c MFr Bld: 6.1 % — ABNORMAL HIGH (ref 4.8–5.6)

## 2022-06-26 ENCOUNTER — Ambulatory Visit
Admission: RE | Admit: 2022-06-26 | Discharge: 2022-06-26 | Disposition: A | Discharge status: Home / Self Care | Payer: BC Managed Care – PPO | Source: Ambulatory Visit | Attending: Emergency Medicine | Admitting: Emergency Medicine

## 2022-06-26 ENCOUNTER — Other Ambulatory Visit: Payer: Self-pay | Admitting: Emergency Medicine

## 2022-06-26 ENCOUNTER — Ambulatory Visit
Admission: EM | Admit: 2022-06-26 | Discharge: 2022-06-26 | Disposition: A | Discharge status: Home / Self Care | Payer: BC Managed Care – PPO | Attending: Emergency Medicine | Admitting: Emergency Medicine

## 2022-06-26 ENCOUNTER — Telehealth: Payer: Self-pay | Admitting: Emergency Medicine

## 2022-06-26 DIAGNOSIS — R2232 Localized swelling, mass and lump, left upper limb: Secondary | ICD-10-CM

## 2022-06-26 DIAGNOSIS — M79601 Pain in right arm: Secondary | ICD-10-CM | POA: Diagnosis not present

## 2022-06-26 DIAGNOSIS — M7989 Other specified soft tissue disorders: Secondary | ICD-10-CM | POA: Diagnosis not present

## 2022-06-26 LAB — COMPREHENSIVE METABOLIC PANEL
ALT: 27 U/L (ref 0–44)
AST: 30 U/L (ref 15–41)
Albumin: 4.7 g/dL (ref 3.5–5.0)
Alkaline Phosphatase: 65 U/L (ref 38–126)
Anion gap: 9 (ref 5–15)
BUN: 13 mg/dL (ref 6–20)
CO2: 25 mmol/L (ref 22–32)
Calcium: 9 mg/dL (ref 8.9–10.3)
Chloride: 98 mmol/L (ref 98–111)
Creatinine, Ser: 1.02 mg/dL (ref 0.61–1.24)
GFR, Estimated: >60 mL/min (ref >60)
Glucose, Bld: 97 mg/dL (ref 70–99)
Potassium: 3.4 mmol/L — ABNORMAL LOW (ref 3.5–5.1)
Sodium: 132 mmol/L — ABNORMAL LOW (ref 135–145)
Total Bilirubin: 0.9 mg/dL (ref 0.3–1.2)
Total Protein: 8.2 g/dL — ABNORMAL HIGH (ref 6.5–8.1)

## 2022-06-26 LAB — CBC WITH DIFFERENTIAL/PLATELET
Abs Immature Granulocytes: 0.02 10*3/uL (ref 0.00–0.07)
Basophils Absolute: 0 10*3/uL (ref 0.0–0.1)
Basophils Relative: 0 %
Eosinophils Absolute: 0.2 10*3/uL (ref 0.0–0.5)
Eosinophils Relative: 2 %
HCT: 45.5 % (ref 39.0–52.0)
Hemoglobin: 14.9 g/dL (ref 13.0–17.0)
Immature Granulocytes: 0 %
Lymphocytes Relative: 21 %
Lymphs Abs: 1.7 10*3/uL (ref 0.7–4.0)
MCH: 27.3 pg (ref 26.0–34.0)
MCHC: 32.7 g/dL (ref 30.0–36.0)
MCV: 83.5 fL (ref 80.0–100.0)
Monocytes Absolute: 0.5 10*3/uL (ref 0.1–1.0)
Monocytes Relative: 6 %
Neutro Abs: 5.8 10*3/uL (ref 1.7–7.7)
Neutrophils Relative %: 71 %
Platelets: 214 10*3/uL (ref 150–400)
RBC: 5.45 MIL/uL (ref 4.22–5.81)
RDW: 13.6 % (ref 11.5–15.5)
WBC: 8.2 10*3/uL (ref 4.0–10.5)
nRBC: 0 % (ref 0.0–0.2)

## 2022-06-26 LAB — SEDIMENTATION RATE: Sed Rate: 4 mm/hr (ref 0–15)

## 2022-06-26 MED ORDER — PREDNISONE 10 MG (21) PO TBPK: ORAL_TABLET | ORAL | 0 refills | Status: DC

## 2022-06-26 NOTE — ED Provider Notes (Addendum)
[facility]    CSN: [id number] Arrival date & time: 06/26/22  1053      History   Chief Complaint Chief Complaint  Patient presents with   Arm Swelling    HPI Harold Hill is a 37 y.o. male.   HPI  37 year old male with a past medical history significant for Graves' disease, asthma, hyperlipidemia, and prediabetes presents for evaluation of left arm swelling that started this morning.  This is not in the setting of any injury.  He states that his arm is starting to go numb as the swelling has been getting progressively worse.  Patient denies smoking, alcohol, or drug use.  Past Medical History:  Diagnosis Date   Asthma    Hyperlipidemia    Thyroid disease     Patient Active Problem List   Diagnosis Date Noted   Prediabetes 04/12/2022   Graves' disease without crisis 04/10/2019   Trigger finger, right ring finger 04/01/2019   Multiple environmental allergies 09/08/2016   Mixed hyperlipidemia 03/01/2016    Past Surgical History:  Procedure Laterality Date   NO PAST SURGERIES         Home Medications    Prior to Admission medications   Medication Sig Start Date End Date Taking? Authorizing Provider  albuterol (VENTOLIN HFA) 108 (90 Base) MCG/ACT inhaler Inhale 2 puffs into the lungs every 6 (six) hours as needed for wheezing or shortness of breath. 04/12/22  Yes Reubin Milan, MD  atorvastatin (LIPITOR) 20 MG tablet Take 1 tablet (20 mg total) by mouth daily. 04/12/22  Yes Reubin Milan, MD  cetirizine (ZYRTEC) 10 MG tablet Take 10 mg by mouth as needed for allergies.   Yes [provider]  methimazole (TAPAZOLE) 5 MG tablet Take 5 mg by mouth daily. 03/25/20  Yes [provider]    Family History Family History  Problem Relation Age of Onset   Hypertension Mother    Hyperlipidemia Mother    Hyperthyroidism Mother    Diabetes Father    Hyperthyroidism Sister     Social History Social History   Tobacco Use    Smoking status: Never   Smokeless tobacco: Never  Vaping Use   Vaping Use: Never used  Substance Use Topics   Alcohol use: Not Currently    Alcohol/week: 0.0 standard drinks of alcohol    Comment: socially   Drug use: No     Allergies   Prednisone   Review of Systems Review of Systems  Constitutional:  Negative for fever.  Musculoskeletal:  Positive for myalgias. Negative for arthralgias and joint swelling.  Skin:  Negative for color change.  Neurological:  Positive for numbness. Negative for weakness.     Physical Exam Triage Vital Signs ED Triage Vitals  Enc Vitals Group     BP 06/26/22 1345 137/85     Pulse Rate 06/26/22 1345 83     Resp 06/26/22 1345 16     Temp 06/26/22 1345 98.9 F (37.2 C)     Temp Source 06/26/22 1345 Oral     SpO2 06/26/22 1345 98 %     Weight 06/26/22 1345 185 lb (83.9 kg)     Height 06/26/22 1345 6' (1.829 m)     Head Circumference --      Peak Flow --      Pain Score 06/26/22 1347 6     Pain Loc --      Pain Edu? --      Excl. in  GC? --    No data found.  Updated Vital Signs BP 137/85 (BP Location: Right Arm)   Pulse 83   Temp 98.9 F (37.2 C) (Oral)   Resp 16   Ht 6' (1.829 m)   Wt 185 lb (83.9 kg)   SpO2 98%   BMI 25.09 kg/m   Visual Acuity Right Eye Distance:   Left Eye Distance:   Bilateral Distance:    Right Eye Near:   Left Eye Near:    Bilateral Near:     Physical Exam Vitals and nursing note reviewed.  Constitutional:      Appearance: Normal appearance.  Musculoskeletal:        General: Swelling and tenderness present. No deformity or signs of injury.  Skin:    General: Skin is warm and dry.     Capillary Refill: Capillary refill takes less than 2 seconds.     Findings: No bruising, erythema or rash.  Neurological:     General: No focal deficit present.     Mental Status: He is alert and oriented to person, place, and time.      UC Treatments / Results  Labs (all labs ordered are listed, but  only abnormal results are displayed) Labs Reviewed  CBC WITH DIFFERENTIAL/PLATELET  COMPREHENSIVE METABOLIC PANEL  SEDIMENTATION RATE    EKG   Radiology No results found.  Procedures Procedures (including critical care time)  Medications Ordered in UC Medications - No data to display  Initial Impression / Assessment and Plan / UC Course  I have reviewed the triage vital signs and the nursing notes.  Pertinent labs & imaging results that were available during my care of the patient were reviewed by me and considered in my medical decision making (see chart for details).   Patient is a pleasant 37 year old male presenting for evaluation of swelling to the left forearm and hand that started this morning when he woke up.  He is complaining of some numbness and tingling in his fingers and a feeling of discomfort but no overt pain.  He denies any injury.  He has no history of blood clots and he is not a smoker and does not drink alcohol.  The patient does take Tapazole and Lipitor for Graves' disease and high cholesterol.  He reports that he does not take these medications daily but endorses it is more like once a week.  He has not taken at the medication in the last week.  On exam the patient's left forearm and hand are swollen when compared to the right.  Radial and ulnar pulses in the left arm are 1+ and cap refill is less than 3 seconds.  There is no erythema or warmth when palpating the forearm or hand.  The left hand actually feels more cool when compared to the right.  The soft tissue also has a fullness to it.  Differentials include venous thromboembolus and possible myositis.  I will order a venous ultrasound of the left arm as well as CBC, CMP, and sedimentation rate.  The ultrasound is unable to be performed at [facility] so I will send the patient to the medical mall at [facility] to obtain his ultrasound.  Blood work will be collected prior to discharge.  Patient CBC is  completely unremarkable.  CMP shows mild hyponatremia with a sodium of 132 and mild hypokalemia with potassium of 3.4.  Total protein is mildly elevated 8.2.  Remainder of electrolytes, renal function, and transaminases are all normal.  ESR is normal at 4.  Radiology impression of left upper extremity ultrasound states that there is no evidence of DVT within the left upper extremity.  The etiology of the patient's swelling is unclear.  I will call and discussed the findings with the patient and we will do a trial of prednisone on a taper to see if this helps with pain and inflammation.  If his symptoms continue, or they worsen, he needs to go to the ER for evaluation.    Final Clinical Impressions(s) / UC Diagnoses   Final diagnoses:  Localized swelling of left forearm     Discharge Instructions      Go to [facility] to have an ultrasound performed of your left arm to rule out the presence of a blood clot.  I will call you once I have the results of the ultrasound as well as your blood work.  If you develop any worsening swelling, pain, or you notice any darkening of the tissues of your arm or hand, or nailbeds, go to the ER for evaluation.     ED Prescriptions   None    PDMP not reviewed this encounter.   Becky Augusta, NP 06/26/22 1452    Becky Augusta, NP 06/26/22 1704

## 2022-06-26 NOTE — Telephone Encounter (Signed)
Due to patient having been discharged 2 hours prior I was unable to send the prescription on his encounter so I had to send a phone message.

## 2022-06-26 NOTE — Discharge Instructions (Signed)
Go to [facility] to have an ultrasound performed of your left arm to rule out the presence of a blood clot.  I will call you once I have the results of the ultrasound as well as your blood work.  If you develop any worsening swelling, pain, or you notice any darkening of the tissues of your arm or hand, or nailbeds, go to the ER for evaluation.

## 2022-06-26 NOTE — ED Triage Notes (Addendum)
Pt c/o L arm swelling since this AM, denies any injuries. States arm started to go numb & swelling getting progressively worse.

## 2022-07-27 ENCOUNTER — Ambulatory Visit
Admission: RE | Admit: 2022-07-27 | Discharge: 2022-07-27 | Disposition: A | Discharge status: Home / Self Care | Payer: BC Managed Care – PPO | Source: Ambulatory Visit | Attending: Physician Assistant | Admitting: Physician Assistant

## 2022-07-27 VITALS — BP 122/80 | HR 90 | Temp 98.5°F | Resp 16 | Ht 72.0 in | Wt 185.0 lb

## 2022-07-27 DIAGNOSIS — R52 Pain, unspecified: Secondary | ICD-10-CM

## 2022-07-27 DIAGNOSIS — B349 Viral infection, unspecified: Secondary | ICD-10-CM

## 2022-07-27 DIAGNOSIS — R5383 Other fatigue: Secondary | ICD-10-CM | POA: Diagnosis not present

## 2022-07-27 DIAGNOSIS — R509 Fever, unspecified: Secondary | ICD-10-CM | POA: Diagnosis not present

## 2022-07-27 LAB — URINALYSIS, W/ REFLEX TO CULTURE (INFECTION SUSPECTED)
Bilirubin Urine: NEGATIVE
Glucose, UA: NEGATIVE mg/dL
Hgb urine dipstick: NEGATIVE
Ketones, ur: NEGATIVE mg/dL
Leukocytes,Ua: NEGATIVE
Nitrite: NEGATIVE
Protein, ur: NEGATIVE mg/dL
Specific Gravity, Urine: 1.02 (ref 1.005–1.030)
pH: 6 (ref 5.0–8.0)

## 2022-07-27 LAB — SARS CORONAVIRUS 2 BY RT PCR: SARS Coronavirus 2 by RT PCR: NEGATIVE

## 2022-07-27 NOTE — Discharge Instructions (Addendum)
-  No sign of UTI and COVID test is negative. - Symptoms likely related to a viral illness.  Open travel and fatigue related to traveling.  No fever today and exam is unremarkable. - We discussed returning for any acute worsening of symptoms or if symptoms change such as you develop chest pain, shortness of breath, abdominal pain, excessive vomiting or diarrhea, headaches, neck pain, etc.  ED for any acute worsening of symptoms, otherwise increase rest and fluids and take Tylenol as needed for discomfort.  Most viruses get better/resolved within a week or so.

## 2022-07-27 NOTE — ED Provider Notes (Signed)
[facility]    CSN: [id number] Arrival date & time: 07/27/22  1352      History   Chief Complaint Chief Complaint  Patient presents with   Fever   Fatigue    HPI Harold Hill is a 37 y.o. male presenting for low energy, loss of appetite, fatigue, lower back pain, body aches, and fever up to 100.8 degrees since yesterday.   Denies headaches, cough, congestion, sore throat, chest pain, abdominal pain, n/v/d. Denies dysuria, hematuria, urgency.   Reports recent travel to [place] for 5 days and returned yesterday. Denies any sick contacts.   Has taken Tylenol last night.  Patient believes he may be dehydrated and thinks he could benefit from IV fluids.  Again he has not had any vomiting or diarrhea and is able to tolerate fluids but states he just does not have the urge to drink.  HPI  Past Medical History:  Diagnosis Date   Asthma    Hyperlipidemia    Thyroid disease     Patient Active Problem List   Diagnosis Date Noted   Prediabetes 04/12/2022   Graves' disease without crisis 04/10/2019   Trigger finger, right ring finger 04/01/2019   Multiple environmental allergies 09/08/2016   Mixed hyperlipidemia 03/01/2016    Past Surgical History:  Procedure Laterality Date   NO PAST SURGERIES         Home Medications    Prior to Admission medications   Medication Sig Start Date End Date Taking? Authorizing Provider  albuterol (VENTOLIN HFA) 108 (90 Base) MCG/ACT inhaler Inhale 2 puffs into the lungs every 6 (six) hours as needed for wheezing or shortness of breath. 04/12/22  Yes Reubin Milan, MD  atorvastatin (LIPITOR) 20 MG tablet Take 1 tablet (20 mg total) by mouth daily. 04/12/22  Yes Reubin Milan, MD  cetirizine (ZYRTEC) 10 MG tablet Take 10 mg by mouth as needed for allergies.   Yes [provider]  methimazole (TAPAZOLE) 5 MG tablet Take 5 mg by mouth daily. 03/25/20  Yes [provider]    Family  History Family History  Problem Relation Age of Onset   Hypertension Mother    Hyperlipidemia Mother    Hyperthyroidism Mother    Diabetes Father    Hyperthyroidism Sister     Social History Social History   Tobacco Use   Smoking status: Never   Smokeless tobacco: Never  Vaping Use   Vaping Use: Never used  Substance Use Topics   Alcohol use: Not Currently    Alcohol/week: 0.0 standard drinks of alcohol    Comment: socially   Drug use: No     Allergies   Prednisone   Review of Systems Review of Systems  Constitutional:  Positive for appetite change, fatigue and fever.  HENT:  Negative for congestion, ear pain, rhinorrhea and sore throat.   Respiratory:  Negative for cough and shortness of breath.   Cardiovascular:  Negative for chest pain.  Gastrointestinal:  Negative for abdominal pain, diarrhea and vomiting.  Genitourinary:  Negative for difficulty urinating, dysuria, flank pain, frequency and hematuria.  Musculoskeletal:  Positive for back pain and myalgias. Negative for neck pain.  Neurological:  Negative for dizziness and headaches.     Physical Exam Triage Vital Signs ED Triage Vitals  Enc Vitals Group     BP      Pulse      Resp      Temp      Temp  src      SpO2      Weight      Height      Head Circumference      Peak Flow      Pain Score      Pain Loc      Pain Edu?      Excl. in GC?    No data found.  Updated Vital Signs BP 122/80 (BP Location: Right Arm)   Pulse 90   Temp 98.5 F (36.9 C) (Oral)   Resp 16   Ht 6' (1.829 m)   Wt 185 lb (83.9 kg)   SpO2 96%   BMI 25.09 kg/m    Physical Exam Vitals and nursing note reviewed.  Constitutional:      General: He is not in acute distress.    Appearance: Normal appearance. He is well-developed. He is not ill-appearing.  HENT:     Head: Normocephalic and atraumatic.     Nose: Nose normal.     Mouth/Throat:     Mouth: Mucous membranes are moist.     Pharynx: Oropharynx is clear.   Eyes:     General: No scleral icterus.    Conjunctiva/sclera: Conjunctivae normal.  Cardiovascular:     Rate and Rhythm: Normal rate and regular rhythm.     Heart sounds: Normal heart sounds.  Pulmonary:     Effort: Pulmonary effort is normal. No respiratory distress.     Breath sounds: Normal breath sounds.  Abdominal:     Palpations: Abdomen is soft.     Tenderness: There is no abdominal tenderness. There is no right CVA tenderness or left CVA tenderness.  Musculoskeletal:     Cervical back: Neck supple.  Skin:    General: Skin is warm and dry.     Capillary Refill: Capillary refill takes less than 2 seconds.  Neurological:     General: No focal deficit present.     Mental Status: He is alert. Mental status is at baseline.     Motor: No weakness.     Gait: Gait normal.  Psychiatric:        Mood and Affect: Mood normal.        Behavior: Behavior normal.      UC Treatments / Results  Labs (all labs ordered are listed, but only abnormal results are displayed) Labs Reviewed  URINALYSIS, W/ REFLEX TO CULTURE (INFECTION SUSPECTED) - Abnormal; Notable for the following components:      Result Value   Bacteria, UA RARE (*)    All other components within normal limits  SARS CORONAVIRUS 2 BY RT PCR    EKG   Radiology No results found.  Procedures Procedures (including critical care time)  Medications Ordered in UC Medications - No data to display  Initial Impression / Assessment and Plan / UC Course  I have reviewed the triage vital signs and the nursing notes.  Pertinent labs & imaging results that were available during my care of the patient were reviewed by me and considered in my medical decision making (see chart for details).   37 year old male presents for fever, fatigue, body aches, loss of appetite and overall low energy since returning from [place] yesterday.  He reports he was in [place] for 5 days.  He denies any illness in any of the other  people who traveled with him.  Vitals are all normal and stable and he is overall well-appearing.  On exam he has normal HEENT exam.  Chest clear to auscultation heart regular rate and rhythm.  Abdomen soft and nontender.  No CVA tenderness.  Urinalysis and COVID test obtained.  Unremarkable urinalysis and COVID test is negative.  Discussed all results with patient.  Advised symptoms likely related to viral illness.  Symptoms such as start yesterday so they may change and he could develop cough, congestion, sore throat, etc.  Exam is unremarkable.  We discussed increasing rest and fluids taking Tylenol as needed for any fevers.  We discussed returning for any acute worsening of symptoms or new symptoms that he is not experiencing at this time that could point toward another cause for his illness.   Final Clinical Impressions(s) / UC Diagnoses   Final diagnoses:  Viral illness  Other fatigue  Body aches  Low grade fever     Discharge Instructions      -No sign of UTI and COVID test is negative. - Symptoms likely related to a viral illness.  Open travel and fatigue related to traveling.  No fever today and exam is unremarkable. - We discussed returning for any acute worsening of symptoms or if symptoms change such as you develop chest pain, shortness of breath, abdominal pain, excessive vomiting or diarrhea, headaches, neck pain, etc.  ED for any acute worsening of symptoms, otherwise increase rest and fluids and take Tylenol as needed for discomfort.  Most viruses get better/resolved within a week or so.     ED Prescriptions   None    PDMP not reviewed this encounter.   Shirlee Latch, PA-C 07/27/22 1459

## 2022-07-27 NOTE — ED Triage Notes (Signed)
Pt c/o fever & fatigue x2 days. Tmax 100. Has tried tylenol w/o relief. Concerned about dehydration has not been drinking enough fluids.

## 2022-10-13 ENCOUNTER — Ambulatory Visit: Payer: BC Managed Care – PPO | Admitting: Internal Medicine

## 2023-01-11 DIAGNOSIS — E0500 Thyrotoxicosis with diffuse goiter without thyrotoxic crisis or storm: Secondary | ICD-10-CM | POA: Diagnosis not present

## 2023-01-11 LAB — TSH: TSH: 0.01 — AB (ref 0.41–5.90)

## 2023-06-25 DIAGNOSIS — L03012 Cellulitis of left finger: Secondary | ICD-10-CM | POA: Diagnosis not present

## 2023-07-24 ENCOUNTER — Telehealth: Payer: Self-pay

## 2023-07-24 ENCOUNTER — Encounter: Payer: Self-pay | Admitting: Internal Medicine

## 2023-07-24 ENCOUNTER — Ambulatory Visit (INDEPENDENT_AMBULATORY_CARE_PROVIDER_SITE_OTHER): Payer: BC Managed Care – PPO | Admitting: Internal Medicine

## 2023-07-24 VITALS — BP 122/80 | HR 95 | Ht 72.0 in | Wt 204.1 lb

## 2023-07-24 DIAGNOSIS — Z0000 Encounter for general adult medical examination without abnormal findings: Secondary | ICD-10-CM

## 2023-07-24 DIAGNOSIS — E0500 Thyrotoxicosis with diffuse goiter without thyrotoxic crisis or storm: Secondary | ICD-10-CM | POA: Diagnosis not present

## 2023-07-24 DIAGNOSIS — R7303 Prediabetes: Secondary | ICD-10-CM

## 2023-07-24 DIAGNOSIS — E782 Mixed hyperlipidemia: Secondary | ICD-10-CM | POA: Diagnosis not present

## 2023-07-24 MED ORDER — ATORVASTATIN CALCIUM 20 MG PO TABS
20.0000 mg | ORAL_TABLET | Freq: Every day | ORAL | 2 refills | Status: AC
Start: 2023-07-24 — End: ?

## 2023-07-24 NOTE — Patient Instructions (Signed)
 It was great to see you today. Below you will find the link to the [facility] community research project I mentioned through [employer]. This is a free genetic screening opportunity specifically looking for three conditions:  Hereditary breast and ovarian cancer syndrome Lynch syndrome (increased risks for colorectal, endometrial and other cancers) Familial hypercholesterolemia (very high cholesterol)  We focus on these three conditions because they can occur in the general population, and if you discover you have one of these conditions, there are specific actions you can take to reduce your risk.  If you'd like to learn more, I recommend visiting this link: [email or web address]

## 2023-07-24 NOTE — Progress Notes (Signed)
 Date:  07/24/2023   Name:  [facility]   DOB:  [birth year]   MRN:  [id number]   Chief Complaint: Annual Exam Harold Hill is a 38 y.o. male who presents today for his Complete Annual Exam. He feels well. He reports exercising regularly. He reports he is sleeping well.   Health Maintenance  Topic Date Due   HIV Screening  Never done   COVID-19 Vaccine (3 - 2024-25 season) 10/29/2022   Flu Shot  09/28/2023   DTaP/Tdap/Td vaccine (2 - Td or Tdap) 09/08/2028   Hepatitis C Screening  Completed   HPV Vaccine  Aged Out   Meningitis B Vaccine  Aged Out    No results found for: "PSA1", "PSA"   Hyperlipidemia This is a chronic problem. The problem is controlled. Pertinent negatives include no chest pain or shortness of breath. Current antihyperlipidemic treatment includes statins. The current treatment provides significant improvement of lipids.  Thyroid  Problem Presents for follow-up (managed by Endo on Methimazole) visit. Patient reports no anxiety, constipation, diarrhea, fatigue or palpitations. The symptoms have been stable. His past medical history is significant for hyperlipidemia.  Diabetes He presents for his follow-up diabetic visit. Diabetes type: prediabetes. His disease course has been stable. Pertinent negatives for hypoglycemia include no dizziness, headaches or nervousness/anxiousness. Pertinent negatives for diabetes include no chest pain, no fatigue and no weakness.    Review of Systems  Constitutional:  Negative for chills, fatigue and unexpected weight change.  HENT:  Negative for nosebleeds.   Eyes:  Negative for visual disturbance.  Respiratory:  Negative for cough, chest tightness, shortness of breath and wheezing.   Cardiovascular:  Negative for chest pain, palpitations and leg swelling.  Gastrointestinal:  Negative for abdominal pain, constipation and diarrhea.  Genitourinary:  Negative for dysuria and hematuria.  Musculoskeletal:  Negative for  arthralgias, gait problem and joint swelling.  Skin:  Negative for color change and rash.  Neurological:  Negative for dizziness, weakness, light-headedness and headaches.  Psychiatric/Behavioral:  Negative for dysphoric mood and sleep disturbance. The patient is not nervous/anxious.      Lab Results  Component Value Date   NA 132 (L) 06/26/2022   K 3.4 (L) 06/26/2022   CO2 25 06/26/2022   GLUCOSE 97 06/26/2022   BUN 13 06/26/2022   CREATININE 1.02 06/26/2022   CALCIUM  9.0 06/26/2022   EGFR 99 04/12/2022   GFRNONAA >60 06/26/2022   Lab Results  Component Value Date   CHOL 207 (H) 04/12/2022   HDL 38 (L) 04/12/2022   LDLCALC 139 (H) 04/12/2022   TRIG 164 (H) 04/12/2022   CHOLHDL 5.4 (H) 04/12/2022   Lab Results  Component Value Date   TSH 0.01 (A) 01/11/2023   Lab Results  Component Value Date   HGBA1C 6.1 (H) 04/12/2022   Lab Results  Component Value Date   WBC 8.2 06/26/2022   HGB 14.9 06/26/2022   HCT 45.5 06/26/2022   MCV 83.5 06/26/2022   PLT 214 06/26/2022   Lab Results  Component Value Date   ALT 27 06/26/2022   AST 30 06/26/2022   ALKPHOS 65 06/26/2022   BILITOT 0.9 06/26/2022   No results found for: "25OHVITD2", "25OHVITD3", "VD25OH"   Patient Active Problem List   Diagnosis Date Noted   Prediabetes 04/12/2022   Graves' disease without crisis 04/10/2019   Trigger finger, right ring finger 04/01/2019   Multiple environmental allergies 09/08/2016   Mixed hyperlipidemia 03/01/2016    Allergies  Allergen Reactions  Prednisone  Other (See Comments)    Severe Acid Reflux    Past Surgical History:  Procedure Laterality Date   NO PAST SURGERIES      Social History   Tobacco Use   Smoking status: Never   Smokeless tobacco: Never  Vaping Use   Vaping status: Never Used  Substance Use Topics   Alcohol use: Not Currently    Alcohol/week: 0.0 standard drinks of alcohol    Comment: socially   Drug use: No     Medication list has been  reviewed and updated.  Current Meds  Medication Sig   albuterol  (VENTOLIN  HFA) 108 (90 Base) MCG/ACT inhaler Inhale 2 puffs into the lungs every 6 (six) hours as needed for wheezing or shortness of breath.   cetirizine (ZYRTEC) 10 MG tablet Take 10 mg by mouth as needed for allergies.   methimazole (TAPAZOLE) 5 MG tablet Take 5 mg by mouth daily.   [DISCONTINUED] atorvastatin  (LIPITOR) 20 MG tablet Take 1 tablet (20 mg total) by mouth daily.       07/24/2023    4:03 PM 04/12/2022   10:17 AM 12/12/2021    1:18 PM 04/11/2021   10:12 AM  GAD 7 : Generalized Anxiety Score  Nervous, Anxious, on Edge 0 1 0 0  Control/stop worrying 0 0 0 0  Worry too much - different things 0 1 0 0  Trouble relaxing 0 0 0 0  Restless  0 0 0  Easily annoyed or irritable  1 0 0  Afraid - awful might happen  0 0 0  Total GAD 7 Score  3 0 0  Anxiety Difficulty  Not difficult at all Not difficult at all        07/24/2023    4:03 PM 04/12/2022   10:17 AM 12/12/2021    1:18 PM  Depression screen PHQ 2/9  Decreased Interest 0 0 0  Down, Depressed, Hopeless 0 0 0  PHQ - 2 Score 0 0 0  Altered sleeping 0 0 1  Tired, decreased energy 0 0 0  Change in appetite 0 0 0  Feeling bad or failure about yourself  0 0 0  Trouble concentrating 0 0 0  Moving slowly or fidgety/restless 0 0 0  Suicidal thoughts 0 0 0  PHQ-9 Score 0 0 1  Difficult doing work/chores Not difficult at all Not difficult at all Not difficult at all    BP Readings from Last 3 Encounters:  07/24/23 122/80  07/27/22 122/80  06/26/22 137/85    Physical Exam Vitals and nursing note reviewed.  Constitutional:      Appearance: Normal appearance. He is well-developed.  HENT:     Head: Normocephalic.     Right Ear: Tympanic membrane, ear canal and external ear normal.     Left Ear: Tympanic membrane, ear canal and external ear normal.     Nose: Nose normal.     Mouth/Throat:     Mouth: Mucous membranes are moist.  Eyes:      Conjunctiva/sclera: Conjunctivae normal.     Pupils: Pupils are equal, round, and reactive to light.  Neck:     Thyroid : No thyromegaly.     Vascular: No carotid bruit.  Cardiovascular:     Rate and Rhythm: Normal rate and regular rhythm.     Pulses: Normal pulses.     Heart sounds: Normal heart sounds.  Pulmonary:     Effort: Pulmonary effort is normal.     Breath sounds: Normal  breath sounds. No wheezing.  Chest:  Breasts:    Right: No mass.     Left: No mass.  Abdominal:     General: Bowel sounds are normal.     Palpations: Abdomen is soft.     Tenderness: There is no abdominal tenderness.  Musculoskeletal:        General: Normal range of motion.     Cervical back: Normal range of motion and neck supple.     Right lower leg: No edema.     Left lower leg: No edema.  Lymphadenopathy:     Cervical: No cervical adenopathy.  Skin:    General: Skin is warm and dry.     Capillary Refill: Capillary refill takes less than 2 seconds.  Neurological:     General: No focal deficit present.     Mental Status: He is alert and oriented to person, place, and time.     Deep Tendon Reflexes: Reflexes are normal and symmetric.  Psychiatric:        Attention and Perception: Attention normal.        Mood and Affect: Mood normal.        Thought Content: Thought content normal.     Wt Readings from Last 3 Encounters:  07/24/23 204 lb 2 oz (92.6 kg)  07/27/22 185 lb (83.9 kg)  06/26/22 185 lb (83.9 kg)    BP 122/80   Pulse 95   Ht 6' (1.829 m)   Wt 204 lb 2 oz (92.6 kg)   SpO2 98%   BMI 27.68 kg/m   Assessment and Plan:  Problem List Items Addressed This Visit       Unprioritized   Mixed hyperlipidemia (Chronic)   LDL is  Lab Results  Component Value Date   LDLCALC 139 (H) 04/12/2022   Current regimen is atorvastatin .  No medication side effects noted. Goal LDL is <100.       Relevant Medications   atorvastatin  (LIPITOR) 20 MG tablet   Other Relevant Orders   Lipid  panel   Graves' disease without crisis (Chronic)   Managed by Endo. On Methimazole daily.      Relevant Orders   CBC with Differential/Platelet   Prediabetes (Chronic)   Managed with diet and exercise. Lab Results  Component Value Date   HGBA1C 6.1 (H) 04/12/2022         Relevant Orders   Comprehensive metabolic panel with GFR   Hemoglobin A1c   Other Visit Diagnoses       Annual physical exam    -  Primary   continue healthy diet and exercise consider Gene Connect testing up to date on immunizations and screenings at this time.   Relevant Orders   CBC with Differential/Platelet   Comprehensive metabolic panel with GFR   Hemoglobin A1c   Lipid panel   Urinalysis, Routine w reflex microscopic       Return in about 1 year (around 07/23/2024) for CPX Dr. Cari Char.    Sheron Dixons, MD [facility] [place]

## 2023-07-24 NOTE — Telephone Encounter (Signed)
 Please review and advise patient.   JM

## 2023-07-24 NOTE — Assessment & Plan Note (Signed)
 Managed by Endo. On Methimazole daily.

## 2023-07-24 NOTE — Assessment & Plan Note (Signed)
 Managed with diet and exercise. Lab Results  Component Value Date   HGBA1C 6.1 (H) 04/12/2022

## 2023-07-24 NOTE — Telephone Encounter (Unsigned)
 Copied from CRM [id number]. Topic: Clinical - Request for Lab/Test Order >> Jul 24, 2023 12:38 PM Star East wrote: Reason for CRM: Patient wants to get labs done today before appt, no orders in system- [phone]

## 2023-07-24 NOTE — Telephone Encounter (Signed)
 Sent pt Mychart message.  KP  Copied from CRM [id number]. Topic: Clinical - Request for Lab/Test Order >> Jul 24, 2023 12:38 PM Harold Hill wrote: Reason for CRM: Patient wants to get labs done today before appt, no orders in system- [phone]

## 2023-07-24 NOTE — Assessment & Plan Note (Signed)
 LDL is  Lab Results  Component Value Date   LDLCALC 139 (H) 04/12/2022   Current regimen is atorvastatin .  No medication side effects noted. Goal LDL is <100.

## 2023-07-25 ENCOUNTER — Ambulatory Visit: Payer: Self-pay | Admitting: Internal Medicine

## 2023-07-25 DIAGNOSIS — E782 Mixed hyperlipidemia: Secondary | ICD-10-CM

## 2023-07-25 LAB — CBC WITH DIFFERENTIAL/PLATELET
Basophils Absolute: 0 10*3/uL (ref 0.0–0.2)
Basos: 0 %
EOS (ABSOLUTE): 0.2 10*3/uL (ref 0.0–0.4)
Eos: 3 %
Hematocrit: 47.4 % (ref 37.5–51.0)
Hemoglobin: 15.2 g/dL (ref 13.0–17.7)
Immature Grans (Abs): 0 10*3/uL (ref 0.0–0.1)
Immature Granulocytes: 0 %
Lymphocytes Absolute: 2.1 10*3/uL (ref 0.7–3.1)
Lymphs: 30 %
MCH: 26.8 pg (ref 26.6–33.0)
MCHC: 32.1 g/dL (ref 31.5–35.7)
MCV: 84 fL (ref 79–97)
Monocytes Absolute: 0.8 10*3/uL (ref 0.1–0.9)
Monocytes: 11 %
Neutrophils Absolute: 4 10*3/uL (ref 1.4–7.0)
Neutrophils: 56 %
Platelets: 244 10*3/uL (ref 150–450)
RBC: 5.68 x10E6/uL (ref 4.14–5.80)
RDW: 13.1 % (ref 11.6–15.4)
WBC: 7.1 10*3/uL (ref 3.4–10.8)

## 2023-07-25 LAB — URINALYSIS, ROUTINE W REFLEX MICROSCOPIC
Bilirubin, UA: NEGATIVE
Ketones, UA: NEGATIVE
Leukocytes,UA: NEGATIVE
Nitrite, UA: NEGATIVE
Protein,UA: NEGATIVE
RBC, UA: NEGATIVE
Specific Gravity, UA: 1.018 (ref 1.005–1.030)
Urobilinogen, Ur: 0.2 mg/dL (ref 0.2–1.0)
pH, UA: 7 (ref 5.0–7.5)

## 2023-07-25 LAB — COMPREHENSIVE METABOLIC PANEL WITH GFR
ALT: 59 IU/L — ABNORMAL HIGH (ref 0–44)
AST: 33 IU/L (ref 0–40)
Albumin: 4.4 g/dL (ref 4.1–5.1)
Alkaline Phosphatase: 118 IU/L (ref 44–121)
BUN/Creatinine Ratio: 17 (ref 9–20)
BUN: 13 mg/dL (ref 6–20)
Bilirubin Total: 0.3 mg/dL (ref 0.0–1.2)
CO2: 21 mmol/L (ref 20–29)
Calcium: 9.3 mg/dL (ref 8.7–10.2)
Chloride: 104 mmol/L (ref 96–106)
Creatinine, Ser: 0.76 mg/dL (ref 0.76–1.27)
Globulin, Total: 2.6 g/dL (ref 1.5–4.5)
Glucose: 105 mg/dL — ABNORMAL HIGH (ref 70–99)
Potassium: 3.9 mmol/L (ref 3.5–5.2)
Sodium: 142 mmol/L (ref 134–144)
Total Protein: 7 g/dL (ref 6.0–8.5)
eGFR: 117 mL/min/{1.73_m2} (ref >59)

## 2023-07-25 LAB — HEMOGLOBIN A1C
Est. average glucose Bld gHb Est-mCnc: 140 mg/dL
Hgb A1c MFr Bld: 6.5 % — ABNORMAL HIGH (ref 4.8–5.6)

## 2023-07-25 LAB — LIPID PANEL
Chol/HDL Ratio: 6.8 ratio — ABNORMAL HIGH (ref 0.0–5.0)
Cholesterol, Total: 184 mg/dL (ref 100–199)
HDL: 27 mg/dL — ABNORMAL LOW (ref >39)
LDL Chol Calc (NIH): 76 mg/dL (ref 0–99)
Triglycerides: 513 mg/dL — ABNORMAL HIGH (ref 0–149)
VLDL Cholesterol Cal: 81 mg/dL — ABNORMAL HIGH (ref 5–40)

## 2023-07-25 MED ORDER — ICOSAPENT ETHYL 1 G PO CAPS
2.0000 g | ORAL_CAPSULE | Freq: Two times a day (BID) | ORAL | 3 refills | Status: AC
Start: 2023-07-25 — End: ?

## 2023-08-01 ENCOUNTER — Telehealth: Payer: Self-pay

## 2023-08-01 ENCOUNTER — Telehealth: Payer: Self-pay | Admitting: Pharmacy Technician

## 2023-08-01 ENCOUNTER — Other Ambulatory Visit (HOSPITAL_COMMUNITY): Payer: Self-pay

## 2023-08-01 NOTE — Telephone Encounter (Signed)
 Pharmacy Patient Advocate Encounter  Received notification from [facility] that Prior Authorization for Icosapent  Ethyl 1GM capsules has been APPROVED from 08/01/23 to 02/26/38. Ran test claim, Copay is $10.00. This test claim was processed through [facility] copay amounts may vary at other pharmacies due to pharmacy/plan contracts, or as the patient moves through the different stages of their insurance plan.   PA #/Case ID/Reference #: [id number]

## 2023-08-01 NOTE — Telephone Encounter (Signed)
 Good morning team,   Please review PA request. Attached covermymeds screenshot.

## 2023-08-01 NOTE — Telephone Encounter (Signed)
 Thanks for the update

## 2023-08-01 NOTE — Telephone Encounter (Signed)
 You're welcome!

## 2023-08-01 NOTE — Telephone Encounter (Signed)
 Pharmacy Patient Advocate Encounter   Received notification from Pt Calls Messages that prior authorization for Icosapent  Ethyl 1GM capsules is required/requested.   Insurance verification completed.   The patient is insured through [facility] .   Per test claim: PA required; PA submitted to above mentioned insurance via CoverMyMeds Key/confirmation #/EOC [id number] Status is pending

## 2023-08-01 NOTE — Telephone Encounter (Signed)
 PA request has been Submitted. New Encounter has been or will be created for follow up. For additional info see Pharmacy Prior Auth telephone encounter from 08/01/23.

## 2023-08-07 ENCOUNTER — Other Ambulatory Visit: Payer: Self-pay | Admitting: Internal Medicine

## 2023-08-07 DIAGNOSIS — E782 Mixed hyperlipidemia: Secondary | ICD-10-CM

## 2023-08-08 DIAGNOSIS — E0500 Thyrotoxicosis with diffuse goiter without thyrotoxic crisis or storm: Secondary | ICD-10-CM | POA: Diagnosis not present

## 2023-08-08 DIAGNOSIS — E119 Type 2 diabetes mellitus without complications: Secondary | ICD-10-CM | POA: Diagnosis not present

## 2023-08-09 NOTE — Telephone Encounter (Signed)
 Requested medication (s) are due for refill today - no  Requested medication (s) are on the active medication list -yes  Future visit scheduled -yes  Last refill: 07/25/23 120 3RF  Notes to clinic: off protocol- provider review - request 90 day supply  Requested Prescriptions  Pending Prescriptions Disp Refills   icosapent  Ethyl (VASCEPA ) 1 g capsule [Pharmacy Med Name: ICOSAPENT  ETHYL 1 GRAM CAPSULE] 360 capsule 1    Sig: TAKE 2 CAPSULES BY MOUTH 2 TIMES DAILY.     Off-Protocol Failed - 08/09/2023 11:30 AM      Failed - Medication not assigned to a protocol, review manually.      Passed - Valid encounter within last 12 months    Recent Outpatient Visits           2 weeks ago Annual physical exam   [place] [facility] at [facility], Chales [place], MD       Future Appointments             In 11 months Barnetta Liberty, MD [facility] at [facility], [place]               Requested Prescriptions  Pending Prescriptions Disp Refills   icosapent  Ethyl (VASCEPA ) 1 g capsule [Pharmacy Med Name: ICOSAPENT  ETHYL 1 GRAM CAPSULE] 360 capsule 1    Sig: TAKE 2 CAPSULES BY MOUTH 2 TIMES DAILY.     Off-Protocol Failed - 08/09/2023 11:30 AM      Failed - Medication not assigned to a protocol, review manually.      Passed - Valid encounter within last 12 months    Recent Outpatient Visits           2 weeks ago Annual physical exam   [facility] at [facility], Chales [place], MD       Future Appointments             In 11 months Barnetta Liberty, MD [place] [facility] at [facility], [facility]

## 2023-09-16 ENCOUNTER — Other Ambulatory Visit: Payer: Self-pay

## 2023-09-16 ENCOUNTER — Encounter: Payer: Self-pay | Admitting: Intensive Care

## 2023-09-16 ENCOUNTER — Encounter: Payer: Self-pay | Admitting: Emergency Medicine

## 2023-09-16 ENCOUNTER — Emergency Department

## 2023-09-16 ENCOUNTER — Ambulatory Visit
Admission: EM | Admit: 2023-09-16 | Discharge: 2023-09-16 | Disposition: A | Discharge status: Home / Self Care | Attending: Physician Assistant | Admitting: Physician Assistant

## 2023-09-16 ENCOUNTER — Emergency Department: Admission: EM | Admit: 2023-09-16 | Discharge: 2023-09-17 | Disposition: A | Discharge status: Home / Self Care

## 2023-09-16 DIAGNOSIS — L03116 Cellulitis of left lower limb: Secondary | ICD-10-CM | POA: Insufficient documentation

## 2023-09-16 DIAGNOSIS — W57XXXA Bitten or stung by nonvenomous insect and other nonvenomous arthropods, initial encounter: Secondary | ICD-10-CM

## 2023-09-16 DIAGNOSIS — M25572 Pain in left ankle and joints of left foot: Secondary | ICD-10-CM

## 2023-09-16 DIAGNOSIS — M25472 Effusion, left ankle: Secondary | ICD-10-CM

## 2023-09-16 DIAGNOSIS — D72829 Elevated white blood cell count, unspecified: Secondary | ICD-10-CM | POA: Diagnosis not present

## 2023-09-16 DIAGNOSIS — M7989 Other specified soft tissue disorders: Secondary | ICD-10-CM | POA: Diagnosis not present

## 2023-09-16 DIAGNOSIS — R600 Localized edema: Secondary | ICD-10-CM | POA: Diagnosis not present

## 2023-09-16 DIAGNOSIS — Z0389 Encounter for observation for other suspected diseases and conditions ruled out: Secondary | ICD-10-CM | POA: Diagnosis not present

## 2023-09-16 DIAGNOSIS — S90562A Insect bite (nonvenomous), left ankle, initial encounter: Secondary | ICD-10-CM | POA: Diagnosis not present

## 2023-09-16 LAB — COMPREHENSIVE METABOLIC PANEL WITH GFR
ALT: 28 U/L (ref 0–44)
AST: 25 U/L (ref 15–41)
Albumin: 4.1 g/dL (ref 3.5–5.0)
Alkaline Phosphatase: 103 U/L (ref 38–126)
Anion gap: 12 (ref 5–15)
BUN: 13 mg/dL (ref 6–20)
CO2: 22 mmol/L (ref 22–32)
Calcium: 9.3 mg/dL (ref 8.9–10.3)
Chloride: 102 mmol/L (ref 98–111)
Creatinine, Ser: 0.92 mg/dL (ref 0.61–1.24)
GFR, Estimated: >60 mL/min (ref >60)
Glucose, Bld: 203 mg/dL — ABNORMAL HIGH (ref 70–99)
Potassium: 4.2 mmol/L (ref 3.5–5.1)
Sodium: 136 mmol/L (ref 135–145)
Total Bilirubin: 0.5 mg/dL (ref 0.0–1.2)
Total Protein: 8.3 g/dL — ABNORMAL HIGH (ref 6.5–8.1)

## 2023-09-16 LAB — CBC WITH DIFFERENTIAL/PLATELET
Abs Immature Granulocytes: 0.03 K/uL (ref 0.00–0.07)
Basophils Absolute: 0 K/uL (ref 0.0–0.1)
Basophils Relative: 0 %
Eosinophils Absolute: 0 K/uL (ref 0.0–0.5)
Eosinophils Relative: 0 %
HCT: 48.4 % (ref 39.0–52.0)
Hemoglobin: 15.8 g/dL (ref 13.0–17.0)
Immature Granulocytes: 0 %
Lymphocytes Relative: 8 %
Lymphs Abs: 0.9 K/uL (ref 0.7–4.0)
MCH: 26.6 pg (ref 26.0–34.0)
MCHC: 32.6 g/dL (ref 30.0–36.0)
MCV: 81.3 fL (ref 80.0–100.0)
Monocytes Absolute: 0.3 K/uL (ref 0.1–1.0)
Monocytes Relative: 3 %
Neutro Abs: 9.8 K/uL — ABNORMAL HIGH (ref 1.7–7.7)
Neutrophils Relative %: 89 %
Platelets: 249 K/uL (ref 150–400)
RBC: 5.95 MIL/uL — ABNORMAL HIGH (ref 4.22–5.81)
RDW: 12.7 % (ref 11.5–15.5)
WBC: 11.1 K/uL — ABNORMAL HIGH (ref 4.0–10.5)
nRBC: 0 % (ref 0.0–0.2)

## 2023-09-16 LAB — URINALYSIS, ROUTINE W REFLEX MICROSCOPIC
Bilirubin Urine: NEGATIVE
Glucose, UA: 150 mg/dL — AB
Hgb urine dipstick: NEGATIVE
Ketones, ur: NEGATIVE mg/dL
Leukocytes,Ua: NEGATIVE
Nitrite: NEGATIVE
Protein, ur: NEGATIVE mg/dL
Specific Gravity, Urine: 1.014 (ref 1.005–1.030)
pH: 7 (ref 5.0–8.0)

## 2023-09-16 LAB — PROTIME-INR
INR: 1 (ref 0.8–1.2)
Prothrombin Time: 13.4 s (ref 11.4–15.2)

## 2023-09-16 LAB — LACTIC ACID, PLASMA
Lactic Acid, Venous: 2 mmol/L (ref 0.5–1.9)
Lactic Acid, Venous: 1.6 mmol/L (ref 0.5–1.9)

## 2023-09-16 LAB — CK: Total CK: 88 U/L (ref 49–397)

## 2023-09-16 LAB — FIBRINOGEN: Fibrinogen: 516 mg/dL — ABNORMAL HIGH (ref 210–475)

## 2023-09-16 LAB — APTT: aPTT: 36 s (ref 24–36)

## 2023-09-16 MED ORDER — SODIUM CHLORIDE 0.9 % IV BOLUS
1000.0000 mL | Freq: Once | INTRAVENOUS | Status: AC
  Administered 2023-09-16: 1000 mL via INTRAVENOUS

## 2023-09-16 MED ORDER — CEPHALEXIN 500 MG PO CAPS: 1000.0000 mg | ORAL_CAPSULE | Freq: Two times a day (BID) | ORAL | 0 refills | Status: AC

## 2023-09-16 MED ORDER — METHYLPREDNISOLONE 4 MG PO TBPK: ORAL_TABLET | ORAL | 0 refills | Status: DC

## 2023-09-16 MED ORDER — IOHEXOL 300 MG/ML  SOLN
100.0000 mL | Freq: Once | INTRAMUSCULAR | Status: AC | PRN
  Administered 2023-09-16: 100 mL via INTRAVENOUS

## 2023-09-16 NOTE — ED Provider Notes (Incomplete)
 [facility] Provider Note    Event Date/Time   First MD Initiated Contact with Patient 09/16/23 1925     (approximate)   History   Foot Swelling   HPI  Harold Hill is a 38 y.o. male        Physical Exam   Triage Vital Signs: ED Triage Vitals  Encounter Vitals Group     BP 09/16/23 1856 (!) 140/95     Girls Systolic BP Percentile --      Girls Diastolic BP Percentile --      Boys Systolic BP Percentile --      Boys Diastolic BP Percentile --      Pulse Rate 09/16/23 1856 (!) 120     Resp 09/16/23 1856 16     Temp 09/16/23 1856 98.6 F (37 C)     Temp Source 09/16/23 1856 Oral     SpO2 09/16/23 1856 100 %     Weight 09/16/23 1857 195 lb (88.5 kg)     Height 09/16/23 1857 6' (1.829 m)     Head Circumference --      Peak Flow --      Pain Score 09/16/23 1857 9     Pain Loc --      Pain Education --      Exclude from Growth Chart --     Most recent vital signs: Vitals:   09/16/23 2230 09/16/23 2310  BP: 126/82   Pulse: (!) 109   Resp: 18   Temp:  98.7 F (37.1 C)  SpO2: 98%     Nursing Triage Note reviewed. Vital signs reviewed and patients oxygen saturation is normoxic***  General: Patient is well nourished, well developed, awake and alert, resting comfortably in no acute distress Head: Normocephalic and atraumatic Eyes: Normal inspection, extraocular muscles intact, no conjunctival pallor Ear, nose, throat: Normal external exam Neck: Normal range of motion Respiratory: Patient is in no respiratory distress, lungs CTAB Cardiovascular: Patient is not tachycardic, RRR without murmur appreciated GI: Abd SNT with no guarding or rebound  Back: Normal inspection of the back with good strength and range of motion throughout all ext Extremities: pulses intact with good cap refills, no LE pitting edema or calf tenderness Neuro: The patient is alert and oriented to person, place, and time, appropriately conversive, with 5/5 bilat  UE/LE strength, no gross motor or sensory defects noted. Coordination appears to be adequate. Skin: Warm, dry, and intact Psych: normal mood and affect, no SI or HI  ED Results / Procedures / Treatments   Labs (all labs ordered are listed, but only abnormal results are displayed) Labs Reviewed  CBC WITH DIFFERENTIAL/PLATELET - Abnormal; Notable for the following components:      Result Value   WBC 11.1 (*)    RBC 5.95 (*)    Neutro Abs 9.8 (*)    All other components within normal limits  COMPREHENSIVE METABOLIC PANEL WITH GFR - Abnormal; Notable for the following components:   Glucose, Bld 203 (*)    Total Protein 8.3 (*)    All other components within normal limits  LACTIC ACID, PLASMA - Abnormal; Notable for the following components:   Lactic Acid, Venous 2.0 (*)    All other components within normal limits  FIBRINOGEN  - Abnormal; Notable for the following components:   Fibrinogen  516 (*)    All other components within normal limits  URINALYSIS, ROUTINE W REFLEX MICROSCOPIC - Abnormal; Notable for the following components:  Color, Urine STRAW (*)    APPearance CLEAR (*)    Glucose, UA 150 (*)    All other components within normal limits  LACTIC ACID, PLASMA  PROTIME-INR  APTT  CK     EKG   RADIOLOGY ***    PROCEDURES:  Critical Care performed: {CriticalCareYesNo:19197::Yes, see critical care procedure note(s),No}  Procedures   MEDICATIONS ORDERED IN ED: Medications  sodium chloride  0.9 % bolus 1,000 mL (0 mLs Intravenous Stopped 09/16/23 2309)  iohexol  (OMNIPAQUE ) 300 MG/ML solution 100 mL (100 mLs Intravenous Contrast Given 09/16/23 2032)     IMPRESSION / MDM / ASSESSMENT AND PLAN / ED COURSE                                Differential diagnosis includes, but is not limited to, ***    ***   Clinical Course as of 09/16/23 2353  Sun Sep 16, 2023  2014 Lactic Acid, Venous(!!): 2.0 Slightly elevated [HD]  2049 Glucose(!): 203 Will advise  the patient that he will need follow-up for this as he may be developing insulin resistance [HD]  2050 WBC(!): 11.1 May be reactive [HD]  2050 Fibrinogen (!): 516 Elevated fibrinogen  not low fibrinogen , making snakebite less likely [HD]  2050 INR: 1.0 [HD]  2050 APTT: 36 No coagulation abnormalities making snakebite lets likely [HD]  2117 US  Venous Img Lower Unilateral Left No DVT [HD]    Clinical Course User Index [HD] Nicholaus Rolland BRAVO, MD     FINAL CLINICAL IMPRESSION(S) / ED DIAGNOSES   Final diagnoses:  None     Rx / DC Orders   ED Discharge Orders     None        Note:  This document was prepared using Dragon voice recognition software and may include unintentional dictation errors.

## 2023-09-16 NOTE — ED Notes (Signed)
 Pt updated that we are still waiting on his CT to result. Pt has no needs at this time. Call bell within reach on bed railing.

## 2023-09-16 NOTE — Discharge Instructions (Addendum)
-   Start the corticosteroid.  I see you have had a lot of acid reflux when taking prednisone  in the past so consider taking omeprazole or Pepcid while you are taking this medicine.  It is similar to prednisone . - As an antibiotic in case you are developing an infection. - Elevate and ice extremity.  May take Benadryl as needed for itching. - If no improvement in a couple of days or worsening swelling, worsening pain or fever go to ER.

## 2023-09-16 NOTE — ED Provider Notes (Signed)
 [facility] Provider Note    Event Date/Time   First MD Initiated Contact with Patient 09/16/23 1925     (approximate)   History   Foot Swelling   HPI  Harold Hill is a 38 y.o. male with no significant past medical history who presents with 3 days of progressively worsening left ankle swelling and redness.  Patient was mowing the lawn when he felt pain in his left ankle.  He did not see any snake or insect in the area.  He initially went to an urgent care facility and was prescribed a Medrol  Dosepak and Keflex  which he has taken as directed.  He has noticed worsening swelling and erythema to the lateral malleolus decided to seek evaluation.  He denies any fevers or chills, headache hearing or vision changes, chest pain shortness of breath or any other issue.  He presents with his father who contributes to history      Physical Exam   Triage Vital Signs: ED Triage Vitals  Encounter Vitals Group     BP 09/16/23 1856 (!) 140/95     Girls Systolic BP Percentile --      Girls Diastolic BP Percentile --      Boys Systolic BP Percentile --      Boys Diastolic BP Percentile --      Pulse Rate 09/16/23 1856 (!) 120     Resp 09/16/23 1856 16     Temp 09/16/23 1856 98.6 F (37 C)     Temp Source 09/16/23 1856 Oral     SpO2 09/16/23 1856 100 %     Weight 09/16/23 1857 195 lb (88.5 kg)     Height 09/16/23 1857 6' (1.829 m)     Head Circumference --      Peak Flow --      Pain Score 09/16/23 1857 9     Pain Loc --      Pain Education --      Exclude from Growth Chart --     Most recent vital signs: Vitals:   09/16/23 2230 09/16/23 2310  BP: 126/82   Pulse: (!) 109   Resp: 18   Temp:  98.7 F (37.1 C)  SpO2: 98%     Nursing Triage Note reviewed. Vital signs reviewed and patients oxygen saturation is normoxic  General: Patient is well nourished, well developed, awake and alert, resting comfortably in no acute distress Head: Normocephalic and  atraumatic Eyes: Normal inspection, extraocular muscles intact, no conjunctival pallor Ear, nose, throat: Normal external exam Neck: Normal range of motion Respiratory: Patient is in no respiratory distress,  Cardiovascular: Patient is tachycardic, regular rhythm GI: Abd SNT with no guarding or rebound  Back: Normal inspection of the back with good strength and range of motion throughout all ext Extremities: pulses intact with good cap refills,  LLE: Ankle is erythematous over the lateral malleolus and though there is seem to be a puncture wound.  There is no crepitus and he has full range of motion.  He is tender to palpation in that region but not over the medial malleolus.  The dorsum of his foot is edematous but he has full sensation Neuro: The patient is alert and oriented to person, place, and time, appropriately conversive, with 5/5 bilat UE/LE strength, no gross motor or sensory defects noted. Coordination appears to be adequate. Skin: Warm, dry, and intact Psych: normal mood and affect, no SI or HI    Media Information  Document Information  Photos    09/16/2023 19:37  Attached To:  Hospital Encounter on 09/16/23  Source Information  Nicholaus Rolland BRAVO, MD  [facility]     ED Results / Procedures / Treatments   Labs (all labs ordered are listed, but only abnormal results are displayed) Labs Reviewed  CBC WITH DIFFERENTIAL/PLATELET - Abnormal; Notable for the following components:      Result Value   WBC 11.1 (*)    RBC 5.95 (*)    Neutro Abs 9.8 (*)    All other components within normal limits  COMPREHENSIVE METABOLIC PANEL WITH GFR - Abnormal; Notable for the following components:   Glucose, Bld 203 (*)    Total Protein 8.3 (*)    All other components within normal limits  LACTIC ACID, PLASMA - Abnormal; Notable for the following components:   Lactic Acid, Venous 2.0 (*)    All other components within normal limits  FIBRINOGEN  - Abnormal; Notable  for the following components:   Fibrinogen  516 (*)    All other components within normal limits  URINALYSIS, ROUTINE W REFLEX MICROSCOPIC - Abnormal; Notable for the following components:   Color, Urine STRAW (*)    APPearance CLEAR (*)    Glucose, UA 150 (*)    All other components within normal limits  LACTIC ACID, PLASMA  PROTIME-INR  APTT  CK      RADIOLOGY US  venous doppler: No DVT CT left ankle: pending, but I do not appreciate a fluid collection on my independent review interpretation    PROCEDURES:  Critical Care performed: No  Procedures   MEDICATIONS ORDERED IN ED: Medications  sodium chloride  0.9 % bolus 1,000 mL (0 mLs Intravenous Stopped 09/16/23 2309)  iohexol  (OMNIPAQUE ) 300 MG/ML solution 100 mL (100 mLs Intravenous Contrast Given 09/16/23 2032)     IMPRESSION / MDM / ASSESSMENT AND PLAN / ED COURSE                                Differential diagnosis includes, but is not limited to, insect bite, snake bite, abscess, DVT, retained foreign body  ED course: Patient arrives and extremity is swollen and does appear to have a puncture wound.  There is no evidence of necrotizing infection and presentation is not consistent with any septic arthritis.  He does have a very mild leukocytosis but has been on prednisone .  His lactic acid was initially 2.0 but repeat was no longer elevated.  We are awaiting the results of the CT imaging.  This is unremarkable patient will be able to return home.  Patient will be signed out to oncoming physician pending this workup  Clinical Course as of 09/17/23 0017  Sun Sep 16, 2023  2014 Lactic Acid, Venous(!!): 2.0 Slightly elevated [HD]  2049 Glucose(!): 203 Will advise the patient that he will need follow-up for this as he may be developing insulin resistance [HD]  2050 WBC(!): 11.1 May be reactive, secondary to being on a Medrol  Dosepak [HD]  2050 Fibrinogen (!): 516 Elevated fibrinogen  not low fibrinogen , making  snakebite less likely [HD]  2050 INR: 1.0 [HD]  2050 APTT: 36 No coagulation abnormalities making snakebite lets likely [HD]  2117 US  Venous Img Lower Unilateral Left No DVT [HD]  2355 Patient updated on the results thus far including the wait for the CT of the left ankle.  I counseled him regarding his glucose result and he will follow-up with  his primary care physician [HD]  Mon Sep 17, 2023  0012 Lactic Acid, Venous: 1.6 Much improved from previous [HD]    Clinical Course User Index [HD] Nicholaus Rolland BRAVO, MD   Risk: 5 This patient has a high risk of morbidity due to further diagnostic testing or treatment. Rationale: This patient's evaluation and management involve a high risk of morbidity due to the potential severity of presenting symptoms, need for diagnostic testing, and/or initiation of treatment that may require close monitoring. The differential includes conditions with potential for significant deterioration or requiring escalation of care. Treatment decisions in the ED, including medication administration, procedural interventions, or disposition planning, reflect this level of risk. Additional Support: -- Drug therapy requiring intensive monitoring for toxicity [ ]  -- Decision regarding elective major surgery with idenitified patient or procedure risk factors [ ]  -- Decision regarding hospitalization or escalation of hospital-level care [ ]  -- Decision not to resuscitate or to de-escalate care because of poor prognosis [ ]  -- Parental controlled substances [ ]   COPA: 5 The patient has a severe exacerbation, progression, or side effect of treatment of the following illness/illnesses: []  OR  The patient has the following acute or chronic illness/injury that poses a possible threat to life or bodily function: [X] : The patient has a potentially serious acute condition or an acute exacerbation of a chronic illness requiring urgent evaluation and management in the Emergency  Department. The clinical presentation necessitates immediate consideration of life-threatening or function-threatening diagnoses, even if they are ultimately ruled out.  Data(2/3 categories following were performed): [5] I reviewed or ordered at least three unique tests, external notes, and/or the history required an independent historian as one of the three requirements as following: CBC, BMP, fibrinogen  AND  I independently interpreted the following test: Doppler ultrasound, CT ankle OR  I discussed the management of the patient with the following external physician or qualified healthcare provider: []     Suggested E/M Coding Level: 5, 99285, This has been selected based on the Oct 18, 2021 CPT guidelines for E/M codes in the Emergency Department based on 2/3 of the CoPA, Data, and Risk.    FINAL CLINICAL IMPRESSION(S) / ED DIAGNOSES   Final diagnoses:  Left ankle swelling  Acute left ankle pain     Rx / DC Orders   ED Discharge Orders     None        Note:  This document was prepared using Dragon voice recognition software and may include unintentional dictation errors.   Nicholaus Rolland BRAVO, MD 09/17/23 [phone]

## 2023-09-16 NOTE — ED Triage Notes (Signed)
 Patient c/o right ankle redness, swelling and tenderness due to insect bite on Friday.  Patient denies fevers.

## 2023-09-16 NOTE — ED Triage Notes (Signed)
 Patient was mowing grass Friday and felt something bite his left ankle. Today having swelling and discoloration. Reports feeling like he was starting to get a fever this afternoon  Seen at [facility] and prescribed antibiotic and steroid this AM. Has taken 1 dose of antibiotic. Came to ER because he feels like the swelling and color is getting worse

## 2023-09-16 NOTE — ED Notes (Signed)
 Pt to CT

## 2023-09-16 NOTE — ED Provider Notes (Addendum)
 [facility]    CSN: [id number] Arrival date & time: 09/16/23  1056      History   Chief Complaint Chief Complaint  Patient presents with   Insect Bite    Left ankle    HPI Harold Hill is a 38 y.o. male presenting for pain, swelling, bruising, rash and redness of the left ankle for the past couple days.  Patient reports he was stung or bitten by some sort of insect without see what it was.  He says the area did not hurt until the following day.  He reports he pushed pus out of the bite site.  He is concerned for possible infection.  He denies fever and has full range of motion of his ankle with pain.  Increased pain when bearing weight as well.  Has been elevating extremity and applying warm compresses without relief.  HPI  Past Medical History:  Diagnosis Date   Asthma    Hyperlipidemia    Thyroid  disease     Patient Active Problem List   Diagnosis Date Noted   Prediabetes 04/12/2022   Graves' disease without crisis 04/10/2019   Trigger finger, right ring finger 04/01/2019   Multiple environmental allergies 09/08/2016   Mixed hyperlipidemia 03/01/2016    Past Surgical History:  Procedure Laterality Date   NO PAST SURGERIES         Home Medications    Prior to Admission medications   Medication Sig Start Date End Date Taking? Authorizing Provider  atorvastatin  (LIPITOR) 20 MG tablet Take 1 tablet (20 mg total) by mouth daily. 07/24/23  Yes Justus Leita DEL, MD  cephALEXin  (KEFLEX ) 500 MG capsule Take 2 capsules (1,000 mg total) by mouth 2 (two) times daily for 7 days. 09/16/23 09/23/23 Yes Arvis Jolan NOVAK, PA-C  methylPREDNISolone  (MEDROL  DOSEPAK) 4 MG TBPK tablet Take po according to dosepack 09/16/23  Yes Arvis Jolan NOVAK, PA-C  albuterol  (VENTOLIN  HFA) 108 (90 Base) MCG/ACT inhaler Inhale 2 puffs into the lungs every 6 (six) hours as needed for wheezing or shortness of breath. 04/12/22   Justus Leita DEL, MD  cetirizine (ZYRTEC) 10 MG tablet Take  10 mg by mouth as needed for allergies.    [provider]  icosapent  Ethyl (VASCEPA ) 1 g capsule Take 2 capsules (2 g total) by mouth 2 (two) times daily. 07/25/23   Justus Leita DEL, MD  methimazole (TAPAZOLE) 5 MG tablet Take 5 mg by mouth daily. 03/25/20   [provider]    Family History Family History  Problem Relation Age of Onset   Hypertension Mother    Hyperlipidemia Mother    Hyperthyroidism Mother    Diabetes Father    Hyperthyroidism Sister     Social History Social History   Tobacco Use   Smoking status: Never   Smokeless tobacco: Never  Vaping Use   Vaping status: Never Used  Substance Use Topics   Alcohol use: Not Currently    Alcohol/week: 0.0 standard drinks of alcohol    Comment: socially   Drug use: No     Allergies   Prednisone    Review of Systems Review of Systems  Constitutional:  Negative for fatigue and fever.  Musculoskeletal:  Positive for arthralgias and joint swelling. Negative for gait problem.  Skin:  Positive for color change, rash and wound.  Neurological:  Negative for weakness.     Physical Exam Triage Vital Signs ED Triage Vitals  Encounter Vitals Group     BP 09/16/23  1112 117/74     Girls Systolic BP Percentile --      Girls Diastolic BP Percentile --      Boys Systolic BP Percentile --      Boys Diastolic BP Percentile --      Pulse Rate 09/16/23 1112 80     Resp 09/16/23 1112 15     Temp 09/16/23 1112 97.7 F (36.5 C)     Temp Source 09/16/23 1112 Oral     SpO2 09/16/23 1112 97 %     Weight 09/16/23 1109 204 lb 2.3 oz (92.6 kg)     Height 09/16/23 1109 6' (1.829 m)     Head Circumference --      Peak Flow --      Pain Score 09/16/23 1109 10     Pain Loc --      Pain Education --      Exclude from Growth Chart --    No data found.  Updated Vital Signs BP 117/74 (BP Location: Left Arm)   Pulse 80   Temp 97.7 F (36.5 C) (Oral)   Resp 15   Ht 6' (1.829 m)   Wt 204 lb 2.3 oz (92.6 kg)    SpO2 97%   BMI 27.69 kg/m      Physical Exam Vitals and nursing note reviewed.  Constitutional:      General: He is not in acute distress.    Appearance: Normal appearance. He is well-developed. He is not ill-appearing.  HENT:     Head: Normocephalic and atraumatic.  Eyes:     Conjunctiva/sclera: Conjunctivae normal.  Cardiovascular:     Rate and Rhythm: Normal rate.     Pulses: Normal pulses.  Pulmonary:     Effort: Pulmonary effort is normal. No respiratory distress.  Musculoskeletal:     Cervical back: Neck supple.     Comments: LEFT ANKLE: See image included in chart. There is contusion and puncture of left posterior-lateral ankle. TTP diffusely of lateral ankle. + swelling and erythema lateral ankle  Skin:    General: Skin is warm and dry.     Capillary Refill: Capillary refill takes less than 2 seconds.  Neurological:     General: No focal deficit present.     Mental Status: He is alert. Mental status is at baseline.     Motor: No weakness.     Gait: Gait normal.  Psychiatric:        Mood and Affect: Mood normal.        Behavior: Behavior normal.         UC Treatments / Results  Labs (all labs ordered are listed, but only abnormal results are displayed) Labs Reviewed - No data to display  EKG   Radiology No results found.  Procedures Procedures (including critical care time)  Medications Ordered in UC Medications - No data to display  Initial Impression / Assessment and Plan / UC Course  I have reviewed the triage vital signs and the nursing notes.  Pertinent labs & imaging results that were available during my care of the patient were reviewed by me and considered in my medical decision making (see chart for details).   38 year old male presents for left ankle pain and swelling after insect bite/sting.  Pain did not start until a day after the sting.  Reports pustular drainage.  No fever and full range of motion of ankle.  See images included in  chart.  Cellulitis and localized inflammatory action insect  bite site.  Treating at this time with Medrol  Dosepak.  Patient has history of reflux caused by prednisone .  Advised to take antacids while taking the corticosteroid.  Elevate and ice extremity.  Start Keflex  to cover infection.  Advised close monitoring.  Reviewed return if not improving in couple days.  Advised ED if fever or acute worsening of pain or swelling.  Final Clinical Impressions(s) / UC Diagnoses   Final diagnoses:  Pain and swelling of left ankle  Insect bite of left ankle, initial encounter  Cellulitis of left lower extremity     Discharge Instructions      - Start the corticosteroid.  I see you have had a lot of acid reflux when taking prednisone  in the past so consider taking omeprazole or Pepcid while you are taking this medicine.  It is similar to prednisone . - As an antibiotic in case you are developing an infection. - Elevate and ice extremity.  May take Benadryl as needed for itching. - If no improvement in a couple of days or worsening swelling, worsening pain or fever go to ER.    ED Prescriptions     Medication Sig Dispense Auth. Provider   methylPREDNISolone  (MEDROL  DOSEPAK) 4 MG TBPK tablet Take po according to dosepack 21 tablet Arvis Huxley B, PA-C   cephALEXin  (KEFLEX ) 500 MG capsule Take 2 capsules (1,000 mg total) by mouth 2 (two) times daily for 7 days. 28 capsule Arvis Huxley NOVAK, PA-C      PDMP not reviewed this encounter.      Arvis Huxley NOVAK, PA-C 09/16/23 [phone]

## 2023-09-16 NOTE — ED Notes (Signed)
 [place] at bedside

## 2023-09-16 NOTE — ED Notes (Signed)
 Pt stated that he thinks the discoloration and pain around the bite on his ankle is getting worse.

## 2023-09-17 DIAGNOSIS — M25572 Pain in left ankle and joints of left foot: Secondary | ICD-10-CM | POA: Diagnosis not present

## 2023-09-17 MED ORDER — DOXYCYCLINE HYCLATE 100 MG PO CAPS: 100.0000 mg | ORAL_CAPSULE | Freq: Two times a day (BID) | ORAL | 0 refills | Status: AC

## 2023-09-17 MED ORDER — DOXYCYCLINE HYCLATE 100 MG PO TABS
100.0000 mg | ORAL_TABLET | Freq: Once | ORAL | Status: AC
  Administered 2023-09-17: 100 mg via ORAL
  Filled 2023-09-17: qty 1

## 2023-09-17 MED ORDER — SODIUM CHLORIDE 0.9 % IV SOLN
1.0000 g | INTRAVENOUS | Status: AC
  Administered 2023-09-17: 1 g via INTRAVENOUS
  Filled 2023-09-17: qty 10

## 2023-09-17 NOTE — ED Notes (Signed)
 ED provider at bedside.

## 2023-09-17 NOTE — ED Notes (Signed)
 This RN called [facility]iology @ [phone] to check on the status of pt's CT results. The person I spoke with stated they would have one of the radiologists read his scan.

## 2023-09-17 NOTE — Discharge Instructions (Addendum)
 As we discussed, your evaluation was generally reassuring, although you do have a skin infection around your ankle and lower leg (cellulitis).  The antibiotic (cephalexin , or Keflex ) you are taking is an appropriate treatment and we encourage you to complete the full course of treatment.  We added an additional antibiotic called doxycycline  as well and you should complete the full course of treatment.  We provided crutches so that you can avoid bearing weight on your ankle but you can bear weight as tolerated, particularly as your symptoms improve.  When you are able to do so, we recommend you elevate your foot and ankle on cushions or pillows. please continue your regular medications including the Medrol  Dosepak you were prescribed by the urgent care.  Follow-up with your regular doctor.  We also provided information about a local foot and ankle specialist (podiatry) that you can call for a follow-up appointment, but your primary care doctor may be the best person with whom to follow-up.    Return to the emergency department if you develop new or worsening symptoms that concern you.

## 2023-09-17 NOTE — ED Provider Notes (Signed)
-----------------------------------------   12:39 AM on 09/17/2023 -----------------------------------------  Assuming care from Dr. Nicholaus.  In short, Harold Hill is a 38 y.o. male with a chief complaint of ankle pain.  Refer to the original H&P for additional details.  The current plan of care is to check CT ankle and reassess.  Anticipate discharge.   Clinical Course as of 09/17/23 0331  Mon Sep 17, 2023  0306 Called back to radiology.  I was informed that there are no [facility] radiologist working currently so no one has picked up the scan.  I ask if it was at all possible for someone to do a preliminary read because the patient has been waiting for about 7 hours since the scan.  They said that they would see if a radiologist is willing to do a preliminary read. [CF]  0330 CT ANKLE LEFT W CONTRAST The radiologist called me to let me know that there seems to have been a technical issue that caused the CT scan to not cross over to the radiologist, but he evaluated the imaging and said that it is consistent with cellulitis without any abscess or fluid collection.  No bony involvement.  I independently viewed the CT as well and I see no evidence of trauma.  I updated the patient with the results.  Given the presence of cellulitis, I recommend he continue the course of Keflex .  For broader spectrum coverage including MRSA coverage, I also started him on a course of doxycycline .  I gave him a one-time dose of Keflex  1 g IV prior to his discharge and we provided crutches so that he can avoid bearing weight or weight-bear as tolerated.  I recommended he keep his foot elevated when possible and follow-up with his primary care doctor.  He understands and agrees with the plan. [CF]    Clinical Course User Index [CF] Gordan Huxley, MD [HD] Nicholaus Rolland BRAVO, MD     Medications  cefTRIAXone  (ROCEPHIN ) 1 g in sodium chloride  0.9 % 100 mL IVPB (1 g Intravenous New Bag/Given 09/17/23 0330)  sodium chloride   0.9 % bolus 1,000 mL (0 mLs Intravenous Stopped 09/16/23 2309)  iohexol  (OMNIPAQUE ) 300 MG/ML solution 100 mL (100 mLs Intravenous Contrast Given 09/16/23 2032)  doxycycline  (VIBRA -TABS) tablet 100 mg (100 mg Oral Given 09/17/23 0328)     ED Discharge Orders          Ordered    doxycycline  (VIBRAMYCIN ) 100 MG capsule  2 times daily        09/17/23 9671           Final diagnoses:  Left ankle swelling  Acute left ankle pain  Cellulitis of left ankle     Gordan Huxley, MD 09/17/23 [phone]

## 2023-10-30 ENCOUNTER — Ambulatory Visit
Admission: EM | Admit: 2023-10-30 | Discharge: 2023-10-30 | Disposition: A | Discharge status: Home / Self Care | Attending: Emergency Medicine | Admitting: Emergency Medicine

## 2023-10-30 DIAGNOSIS — U071 COVID-19: Secondary | ICD-10-CM | POA: Insufficient documentation

## 2023-10-30 DIAGNOSIS — E0500 Thyrotoxicosis with diffuse goiter without thyrotoxic crisis or storm: Secondary | ICD-10-CM | POA: Diagnosis not present

## 2023-10-30 DIAGNOSIS — J45909 Unspecified asthma, uncomplicated: Secondary | ICD-10-CM | POA: Insufficient documentation

## 2023-10-30 DIAGNOSIS — R509 Fever, unspecified: Secondary | ICD-10-CM | POA: Diagnosis not present

## 2023-10-30 LAB — RESP PANEL BY RT-PCR (FLU A&B, COVID) ARPGX2
Influenza A by PCR: NEGATIVE
Influenza B by PCR: NEGATIVE
SARS Coronavirus 2 by RT PCR: POSITIVE — AB

## 2023-10-30 MED ORDER — ACETAMINOPHEN 325 MG PO TABS
975.0000 mg | ORAL_TABLET | Freq: Once | ORAL | Status: AC
  Administered 2023-10-30: 975 mg via ORAL

## 2023-10-30 NOTE — ED Provider Notes (Signed)
 [facility]    CSN: [id number] Arrival date & time: 10/30/23  1413      History   Chief Complaint Chief Complaint  Patient presents with   Fever   Generalized Body Aches   Chills    HPI Harold Hill is a 38 y.o. male.   38 year old malept, [facility], presents to urgent care for evaluation of fever, body aches and chills x 2 days. T max 100.4 last night, has tried OTC meds without relief. Wife has same symptoms.  PMH: Asthma, Hyperlipidemia, thyroid   The history is provided by the patient. No language interpreter was used.    Past Medical History:  Diagnosis Date   Asthma    Hyperlipidemia    Thyroid  disease     Patient Active Problem List   Diagnosis Date Noted   COVID 10/30/2023   Prediabetes 04/12/2022   Graves' disease without crisis 04/10/2019   Trigger finger, right ring finger 04/01/2019   Multiple environmental allergies 09/08/2016   Mixed hyperlipidemia 03/01/2016    Past Surgical History:  Procedure Laterality Date   NO PAST SURGERIES         Home Medications    Prior to Admission medications   Medication Sig Start Date End Date Taking? Authorizing Provider  albuterol  (VENTOLIN  HFA) 108 (90 Base) MCG/ACT inhaler Inhale 2 puffs into the lungs every 6 (six) hours as needed for wheezing or shortness of breath. 04/12/22   Justus Leita DEL, MD  atorvastatin  (LIPITOR) 20 MG tablet Take 1 tablet (20 mg total) by mouth daily. 07/24/23   Justus Leita DEL, MD  cetirizine (ZYRTEC) 10 MG tablet Take 10 mg by mouth as needed for allergies.    [provider]  icosapent  Ethyl (VASCEPA ) 1 g capsule Take 2 capsules (2 g total) by mouth 2 (two) times daily. 07/25/23   Justus Leita DEL, MD  methimazole (TAPAZOLE) 5 MG tablet Take 5 mg by mouth daily. 03/25/20   [provider]  methylPREDNISolone  (MEDROL  DOSEPAK) 4 MG TBPK tablet Take po according to dosepack 09/16/23   Arvis Jolan NOVAK, PA-C    Family History Family  History  Problem Relation Age of Onset   Hypertension Mother    Hyperlipidemia Mother    Hyperthyroidism Mother    Diabetes Father    Hyperthyroidism Sister     Social History Social History   Tobacco Use   Smoking status: Never   Smokeless tobacco: Never  Vaping Use   Vaping status: Never Used  Substance Use Topics   Alcohol use: Yes    Comment: socially   Drug use: No     Allergies   Prednisone    Review of Systems Review of Systems  Constitutional:  Positive for chills and fever.  HENT:  Positive for congestion.   Respiratory:  Negative for cough.   Musculoskeletal:  Positive for myalgias.  All other systems reviewed and are negative.    Physical Exam Triage Vital Signs ED Triage Vitals  Encounter Vitals Group     BP 10/30/23 1421 120/77     Girls Systolic BP Percentile --      Girls Diastolic BP Percentile --      Boys Systolic BP Percentile --      Boys Diastolic BP Percentile --      Pulse Rate 10/30/23 1421 (!) 106     Resp 10/30/23 1421 16     Temp 10/30/23 1421 (!) 100.4 F (38 C)     Temp  Source 10/30/23 1421 Oral     SpO2 10/30/23 1421 96 %     Weight 10/30/23 1421 204 lb 2.3 oz (92.6 kg)     Height 10/30/23 1421 6' (1.829 m)     Head Circumference --      Peak Flow --      Pain Score 10/30/23 1424 8     Pain Loc --      Pain Education --      Exclude from Growth Chart --    No data found.  Updated Vital Signs BP 120/77 (BP Location: Right Arm)   Pulse (!) 106   Temp (!) 100.4 F (38 C) (Oral)   Resp 16   Ht 6' (1.829 m)   Wt 204 lb 2.3 oz (92.6 kg)   SpO2 96%   BMI 27.69 kg/m   Visual Acuity Right Eye Distance:   Left Eye Distance:   Bilateral Distance:    Right Eye Near:   Left Eye Near:    Bilateral Near:     Physical Exam Vitals and nursing note reviewed.  Constitutional:      General: He is not in acute distress.    Appearance: He is well-developed. He is not ill-appearing or toxic-appearing.  HENT:     Head:  Normocephalic.     Right Ear: Tympanic membrane is retracted.     Left Ear: Tympanic membrane is retracted.     Nose: Mucosal edema and congestion present.     Mouth/Throat:     Lips: Pink.     Mouth: Mucous membranes are moist.     Pharynx: Oropharynx is clear. Uvula midline.  Eyes:     General: Lids are normal.     Conjunctiva/sclera: Conjunctivae normal.     Pupils: Pupils are equal, round, and reactive to light.  Cardiovascular:     Rate and Rhythm: Regular rhythm. Tachycardia present.     Heart sounds: Normal heart sounds.  Pulmonary:     Effort: Pulmonary effort is normal. No respiratory distress.     Breath sounds: Normal breath sounds and air entry. No decreased breath sounds or wheezing.  Abdominal:     General: There is no distension.     Palpations: Abdomen is soft.  Musculoskeletal:        General: Normal range of motion.     Cervical back: Normal range of motion.  Skin:    General: Skin is warm and dry.     Findings: No rash.  Neurological:     General: No focal deficit present.     Mental Status: He is alert and oriented to person, place, and time.     GCS: GCS eye subscore is 4. GCS verbal subscore is 5. GCS motor subscore is 6.     Cranial Nerves: No cranial nerve deficit.     Sensory: No sensory deficit.  Psychiatric:        Attention and Perception: Attention normal.        Mood and Affect: Mood normal.        Speech: Speech normal.        Behavior: Behavior normal. Behavior is cooperative.      UC Treatments / Results  Labs (all labs ordered are listed, but only abnormal results are displayed) Labs Reviewed  RESP PANEL BY RT-PCR (FLU A&B, COVID) ARPGX2 - Abnormal; Notable for the following components:      Result Value   SARS Coronavirus 2 by RT PCR POSITIVE (*)  All other components within normal limits    EKG   Radiology No results found.  Procedures Procedures (including critical care time)  Medications Ordered in UC Medications   acetaminophen  (TYLENOL ) tablet 975 mg (975 mg Oral Given 10/30/23 1430)    Initial Impression / Assessment and Plan / UC Course  I have reviewed the triage vital signs and the nursing notes.  Pertinent labs & imaging results that were available during my care of the patient were reviewed by me and considered in my medical decision making (see chart for details).    Discussed exam findings and plan of care with patient, covid +, strict go to ER precautions given.   Patient verbalized understanding to this provider.  Iik:Rncpi,cpmjo illness, allergies Final Clinical Impressions(s) / UC Diagnoses   Final diagnoses:  COVID     Discharge Instructions      You are positive for covid, this is a viral illness, may use over the counter meds for symptom management.  The recommendations suggest returning to normal activities when, for at least 24 hours, symptoms are improving overall, and if a fever was present, it has been gone without use of a fever-reducing medication.  Once people resume normal activities, they are encouraged to take additional prevention strategies for the next 5 days to curb disease spread, such as taking more steps for cleaner air, enhancing hygiene practices, wearing a well-fitting mask, keeping a distance from others, and/or getting tested for respiratory viruses     ED Prescriptions   None    PDMP not reviewed this encounter.   Aminta Loose, NP 10/30/23 2019

## 2023-10-30 NOTE — ED Triage Notes (Signed)
 Pt c/o fever,bodyaches & chills x2 days. Tmax 100.4 last night. Has tried OTC meds w/o relief.

## 2023-10-30 NOTE — Discharge Instructions (Signed)
 You are positive for covid, this is a viral illness, may use over the counter meds for symptom management.  The recommendations suggest returning to normal activities when, for at least 24 hours, symptoms are improving overall, and if a fever was present, it has been gone without use of a fever-reducing medication.  Once people resume normal activities, they are encouraged to take additional prevention strategies for the next 5 days to curb disease spread, such as taking more steps for cleaner air, enhancing hygiene practices, wearing a well-fitting mask, keeping a distance from others, and/or getting tested for respiratory viruses

## 2024-01-17 ENCOUNTER — Emergency Department
Admission: EM | Admit: 2024-01-17 | Discharge: 2024-01-17 | Disposition: A | Discharge status: Home / Self Care | Attending: Emergency Medicine | Admitting: Emergency Medicine

## 2024-01-17 ENCOUNTER — Other Ambulatory Visit: Payer: Self-pay

## 2024-01-17 DIAGNOSIS — L509 Urticaria, unspecified: Secondary | ICD-10-CM

## 2024-01-17 DIAGNOSIS — T7840XA Allergy, unspecified, initial encounter: Secondary | ICD-10-CM | POA: Diagnosis not present

## 2024-01-17 DIAGNOSIS — L500 Allergic urticaria: Secondary | ICD-10-CM | POA: Diagnosis not present

## 2024-01-17 MED ORDER — PREDNISONE 20 MG PO TABS: 60.0000 mg | ORAL_TABLET | Freq: Every day | ORAL | 0 refills | Status: AC

## 2024-01-17 MED ORDER — PREDNISONE 20 MG PO TABS
60.0000 mg | ORAL_TABLET | Freq: Once | ORAL | Status: AC
  Administered 2024-01-17: 60 mg via ORAL
  Filled 2024-01-17: qty 3

## 2024-01-17 NOTE — ED Triage Notes (Signed)
 Patient ambulatory to triage with complaints of possible allergic reaction. Unknown to what. States it started approx 2 hour ago. Has some welts to his back, left eye swelling, and lip swelling. Took a claritin 20 mins PTA. Patient does take lisinopril, last had last week.

## 2024-01-17 NOTE — ED Provider Notes (Signed)
 [facility] Provider Note    Event Date/Time   First MD Initiated Contact with Patient 01/17/24 2143     (approximate)   History   Allergic Reaction   HPI  Harold Hill is a 38 y.o. male with Graves' disease on methimazole who comes ED complaining of itchy raised areas on his back along with lower lip swelling and eyelid swelling.  Started about 2 hours ago.  Reports that this happens every year around this time.  Just recently turned on heating system for the house with cooler weather.  Denies chest pain shortness of breath fever cough vomiting abdominal pain.  No known allergies, no new exposures.     Physical Exam   Triage Vital Signs: ED Triage Vitals  Encounter Vitals Group     BP 01/17/24 2132 (!) 149/102     Girls Systolic BP Percentile --      Girls Diastolic BP Percentile --      Boys Systolic BP Percentile --      Boys Diastolic BP Percentile --      Pulse Rate 01/17/24 2132 97     Resp 01/17/24 2132 18     Temp 01/17/24 2132 97.9 F (36.6 C)     Temp Source 01/17/24 2132 Oral     SpO2 01/17/24 2132 99 %     Weight 01/17/24 2133 210 lb (95.3 kg)     Height 01/17/24 2133 6' (1.829 m)     Head Circumference --      Peak Flow --      Pain Score 01/17/24 2133 0     Pain Loc --      Pain Education --      Exclude from Growth Chart --     Most recent vital signs: Vitals:   01/17/24 2132  BP: (!) 149/102  Pulse: 97  Resp: 18  Temp: 97.9 F (36.6 C)  SpO2: 99%    General: Awake, no distress.  CV:  Good peripheral perfusion.  Regular rate rhythm Resp:  Normal effort.  Clear to auscultation bilaterally Abd:  No distention.  Soft nontender Other:  Scattered urticaria on the back.  Oropharynx normal, uvula nonedematous.  No tongue swelling or elevation.  There is mild edema of the lower lip and eyelids   ED Results / Procedures / Treatments   Labs (all labs ordered are listed, but only abnormal results are displayed) Labs  Reviewed - No data to display   RADIOLOGY    PROCEDURES:  Procedures   MEDICATIONS ORDERED IN ED: Medications  predniSONE  (DELTASONE ) tablet 60 mg (has no administration in time range)     IMPRESSION / MDM / ASSESSMENT AND PLAN / ED COURSE  I reviewed the triage vital signs and the nursing notes.                             Patient presents with urticaria of the back and some swelling of the eyelids and lower lip which is mild, all limited to the integumentary system.  Not consistent with anaphylaxis.  Doubt ACE inhibitor angioedema or hereditary angioedema.  Suspect that is related to skin dryness.  Will start on prednisone , continue Zyrtec.  Stable for discharge      FINAL CLINICAL IMPRESSION(S) / ED DIAGNOSES   Final diagnoses:  Urticaria     Rx / DC Orders   ED Discharge Orders  Ordered    predniSONE  (DELTASONE ) 20 MG tablet  Daily with breakfast        01/17/24 2159             Note:  This document was prepared using Dragon voice recognition software and may include unintentional dictation errors.   Viviann Pastor, MD 01/17/24 2203

## 2024-02-06 ENCOUNTER — Encounter: Payer: Self-pay | Admitting: Internal Medicine

## 2024-02-06 ENCOUNTER — Ambulatory Visit: Payer: Self-pay

## 2024-02-06 ENCOUNTER — Ambulatory Visit (INDEPENDENT_AMBULATORY_CARE_PROVIDER_SITE_OTHER): Admitting: Internal Medicine

## 2024-02-06 VITALS — BP 122/74 | HR 92 | Ht 72.0 in | Wt 209.0 lb

## 2024-02-06 DIAGNOSIS — E782 Mixed hyperlipidemia: Secondary | ICD-10-CM

## 2024-02-06 DIAGNOSIS — G44209 Tension-type headache, unspecified, not intractable: Secondary | ICD-10-CM

## 2024-02-06 DIAGNOSIS — T783XXD Angioneurotic edema, subsequent encounter: Secondary | ICD-10-CM | POA: Diagnosis not present

## 2024-02-06 DIAGNOSIS — T783XXA Angioneurotic edema, initial encounter: Secondary | ICD-10-CM | POA: Insufficient documentation

## 2024-02-06 DIAGNOSIS — R7303 Prediabetes: Secondary | ICD-10-CM | POA: Diagnosis not present

## 2024-02-06 MED ORDER — CYCLOBENZAPRINE HCL 10 MG PO TABS: ORAL_TABLET | ORAL | 0 refills | Status: AC

## 2024-02-06 MED ORDER — PREDNISONE 10 MG PO TABS: ORAL_TABLET | ORAL | 0 refills | Status: DC

## 2024-02-06 NOTE — Telephone Encounter (Signed)
 FYI Only or Action Required?: FYI only for provider: appointment scheduled on 02/06/24.  Patient was last seen in primary care on 07/24/2023 by Justus Leita DEL, MD.  Called Nurse Triage reporting Headache.  Symptoms began yesterday.  Interventions attempted: OTC medications: ibuprofen .  Symptoms are: gradually worsening.  Triage Disposition: See Physician Within 24 Hours  Patient/caregiver understands and will follow disposition?: Yes              Copied from CRM #[id number]. Topic: Clinical - Red Word Triage >> Feb 06, 2024 11:13 AM Terri MATSU wrote: Red Word that prompted transfer to Nurse Triage: Severe migraines for 2days now. Reason for Disposition  [1] MODERATE headache (e.g., interferes with normal activities) AND [2] present > 24 hours AND [3] unexplained  (Exceptions: Pain medicines not tried, typical migraine, or headache part of viral illness.)  Answer Assessment - Initial Assessment Questions 1. LOCATION: Where does it hurt?      Forehead.  2. ONSET: When did the headache start? (e.g., minutes, hours, days)      Yesterday morning.  3. PATTERN: Does the pain come and go, or has it been constant since it started?     Felt like it was improving yesterday afternoon but worsened again this morning. Constant.  4. SEVERITY: How bad is the pain? and What does it keep you from doing?  (e.g., Scale 1-10; mild, moderate, or severe)     8/10, took 2 ibuprofen  30 minutes ago.  5. RECURRENT SYMPTOM: Have you ever had headaches before? If Yes, ask: When was the last time? and What happened that time?      Yes but states it has been a long time/years.  6. CAUSE: What do you think is causing the headache?     Unsure.  7. MIGRAINE: Have you been diagnosed with migraine headaches? If Yes, ask: Is this headache similar?      No.  8. HEAD INJURY: Has there been any recent injury to your head?      No.  9. OTHER SYMPTOMS: Do you have any other  symptoms? (e.g., fever, stiff neck, eye pain, sore throat, cold symptoms)     High blood pressure this AM 140s/89 and again 130s/92. No fever, stiff neck, nausea, vomiting, numbness or weakness, changes in speech or vision, cold symptoms, sore throat.  Protocols used: [facility]

## 2024-02-06 NOTE — Assessment & Plan Note (Signed)
 This is his second episode in 2 months; he also has intermittent hives seemingly triggered by cold weather. Recommend resuming daily Zyrtec; take benadryl if needed Steroid taper - he will go to the ED if any swelling of the tongue/throat or trouble breathing. Refer to Allergy specialist.

## 2024-02-06 NOTE — Assessment & Plan Note (Signed)
 Last A1C 6.5.  he is working on diet changes Will repeat A1C in the near future.

## 2024-02-06 NOTE — Patient Instructions (Signed)
 Take muscle relaxant and advil  twice a day as needed for tension type headache  Take zyrtec every day;  take Benadryl as needed  Take the steroid taper  Will refer to Allergy specialist.

## 2024-02-06 NOTE — Assessment & Plan Note (Signed)
 Vascepa  added last visit with no side effects. He continues on Atorvastatin  as well. Will get lipids later this week - ordered

## 2024-02-06 NOTE — Progress Notes (Signed)
 Date:  02/06/2024   Name:  Harold Hill   DOB:  01-Jun-1985   MRN:  [id number]   Chief Complaint: Headache (Constant headache since yesterday morning. Tried Ibuprofen  and no relief. ) and Oral Swelling (Started while patient was sitting in the lobby waiting to called back. Patient had an episode 3 weeks ago and had to go to ER for allergic reaction. Unsure of what the reaction was for. Needs referral to allergist for allergy testing.)  Headache  This is a new problem. The current episode started yesterday. The problem occurs constantly. The problem has been unchanged. The pain is located in the Frontal region. The pain does not radiate. The pain quality is not similar to prior headaches. The quality of the pain is described as aching and squeezing. The pain is at a severity of 6/10. The pain is moderate. Pertinent negatives include no abdominal pain, dizziness, eye pain, eye redness, fever, phonophobia, photophobia, visual change, vomiting or weakness. Nothing aggravates the symptoms. He has tried NSAIDs for the symptoms. The treatment provided no relief. There is no history of hypertension, migraine headaches, migraines in the family, recent head traumas or TMJ.  Hyperlipidemia This is a chronic problem. The problem is uncontrolled. Pertinent negatives include no chest pain or shortness of breath. Current antihyperlipidemic treatment includes statins and herbal therapy (started Vascepa  last visit).  Diabetes He presents for his follow-up diabetic visit. Diabetes type: prediabetes - last a1c 6.5. Hypoglycemia symptoms include headaches. Pertinent negatives for hypoglycemia include no dizziness or nervousness/anxiousness. Pertinent negatives for diabetes include no chest pain, no visual change and no weakness. Current diabetic treatment includes diet. He is compliant with treatment most of the time.  Swelling of lip - started while in the exam room.  Had similar symptoms and went to [facility] in October.   No new exposures to food or medications.     Review of Systems  Constitutional:  Negative for appetite change, fever and unexpected weight change.  HENT:         Swelling of upper lip started while being worked up by [employer]  Eyes:  Negative for photophobia, pain, redness and visual disturbance.  Respiratory:  Negative for chest tightness and shortness of breath.   Cardiovascular:  Negative for chest pain, palpitations and leg swelling.  Gastrointestinal:  Negative for abdominal pain and vomiting.  Neurological:  Positive for headaches. Negative for dizziness and weakness.  Psychiatric/Behavioral:  Negative for dysphoric mood and sleep disturbance. The patient is not nervous/anxious.      Lab Results  Component Value Date   NA 136 09/16/2023   K 4.2 09/16/2023   CO2 22 09/16/2023   GLUCOSE 203 (H) 09/16/2023   BUN 13 09/16/2023   CREATININE 0.92 09/16/2023   CALCIUM  9.3 09/16/2023   EGFR 117 07/24/2023   GFRNONAA >60 09/16/2023   Lab Results  Component Value Date   CHOL 184 07/24/2023   HDL 27 (L) 07/24/2023   LDLCALC 76 07/24/2023   TRIG 513 (H) 07/24/2023   CHOLHDL 6.8 (H) 07/24/2023   Lab Results  Component Value Date   TSH 0.01 (A) 01/11/2023   Lab Results  Component Value Date   HGBA1C 6.5 (H) 07/24/2023   Lab Results  Component Value Date   WBC 11.1 (H) 09/16/2023   HGB 15.8 09/16/2023   HCT 48.4 09/16/2023   MCV 81.3 09/16/2023   PLT 249 09/16/2023   Lab Results  Component Value Date   ALT 28 09/16/2023  AST 25 09/16/2023   ALKPHOS 103 09/16/2023   BILITOT 0.5 09/16/2023   No results found for: MARIEN BOLLS, VD25OH   Patient Active Problem List   Diagnosis Date Noted   Angio-edema 02/06/2024   COVID 10/30/2023   Prediabetes 04/12/2022   Graves' disease without crisis 04/10/2019   Trigger finger, right ring finger 04/01/2019   Multiple environmental allergies 09/08/2016   Mixed hyperlipidemia 03/01/2016    No Known  Allergies  Past Surgical History:  Procedure Laterality Date   NO PAST SURGERIES      Social History   Tobacco Use   Smoking status: Never   Smokeless tobacco: Never  Vaping Use   Vaping status: Never Used  Substance Use Topics   Alcohol use: Yes    Comment: socially   Drug use: No     Medication list has been reviewed and updated.  Current Meds  Medication Sig   albuterol  (VENTOLIN  HFA) 108 (90 Base) MCG/ACT inhaler Inhale 2 puffs into the lungs every 6 (six) hours as needed for wheezing or shortness of breath.   atorvastatin  (LIPITOR) 20 MG tablet Take 1 tablet (20 mg total) by mouth daily.   cetirizine (ZYRTEC) 10 MG tablet Take 10 mg by mouth as needed for allergies.   cyclobenzaprine (FLEXERIL) 10 MG tablet One twice a day for tension headache.   icosapent  Ethyl (VASCEPA ) 1 g capsule Take 2 capsules (2 g total) by mouth 2 (two) times daily.   methimazole (TAPAZOLE) 5 MG tablet Take 5 mg by mouth daily.   predniSONE  (DELTASONE ) 10 MG tablet Take 6 tablets (60 mg total) by mouth daily with breakfast for 2 days, THEN 5 tablets (50 mg total) daily with breakfast for 2 days, THEN 4 tablets (40 mg total) daily with breakfast for 2 days, THEN 3 tablets (30 mg total) daily with breakfast for 2 days, THEN 2 tablets (20 mg total) daily with breakfast for 2 days, THEN 1 tablet (10 mg total) daily with breakfast for 2 days.       02/06/2024    1:33 PM 07/24/2023    4:03 PM 04/12/2022   10:17 AM 12/12/2021    1:18 PM  GAD 7 : Generalized Anxiety Score  Nervous, Anxious, on Edge 0 0 1 0  Control/stop worrying 0 0 0 0  Worry too much - different things 0 0 1 0  Trouble relaxing 0 0 0 0  Restless 0  0 0  Easily annoyed or irritable 0  1 0  Afraid - awful might happen 0  0 0  Total GAD 7 Score 0  3 0  Anxiety Difficulty Not difficult at all  Not difficult at all Not difficult at all       02/06/2024    1:33 PM 07/24/2023    4:03 PM 04/12/2022   10:17 AM  Depression screen PHQ  2/9  Decreased Interest 0 0 0  Down, Depressed, Hopeless 0 0 0  PHQ - 2 Score 0 0 0  Altered sleeping 0 0 0  Tired, decreased energy 0 0 0  Change in appetite 0 0 0  Feeling bad or failure about yourself  0 0 0  Trouble concentrating 0 0 0  Moving slowly or fidgety/restless 0 0 0  Suicidal thoughts 0 0 0  PHQ-9 Score 0 0  0   Difficult doing work/chores Not difficult at all Not difficult at all Not difficult at all     Data saved with a previous flowsheet  row definition    BP Readings from Last 3 Encounters:  02/06/24 122/74  01/17/24 (!) 149/102  10/30/23 120/77    Physical Exam Vitals and nursing note reviewed.  Constitutional:      General: He is not in acute distress.    Appearance: He is well-developed.  HENT:     Head: Normocephalic and atraumatic. No right periorbital erythema or left periorbital erythema.     Jaw: Malocclusion present. No tenderness.     Comments: Mild temporal tenderness, no cord    Right Ear: Tympanic membrane normal.     Left Ear: Tympanic membrane normal.     Nose:     Right Sinus: No maxillary sinus tenderness or frontal sinus tenderness.     Left Sinus: No maxillary sinus tenderness or frontal sinus tenderness.     Mouth/Throat:     Mouth: Mucous membranes are moist. Angioedema present.     Pharynx: Oropharynx is clear.   Eyes:     Extraocular Movements: Extraocular movements intact.     Conjunctiva/sclera: Conjunctivae normal.  Neck:     Thyroid : No thyroid  mass.  Cardiovascular:     Rate and Rhythm: Normal rate and regular rhythm.  Pulmonary:     Effort: Pulmonary effort is normal. No respiratory distress.     Breath sounds: No wheezing or rhonchi.  Musculoskeletal:     Cervical back: Normal and full passive range of motion without pain. No spasms or tenderness. Normal range of motion.  Lymphadenopathy:     Cervical: No cervical adenopathy.  Skin:    General: Skin is warm and dry.     Findings: No rash.  Neurological:      General: No focal deficit present.     Mental Status: He is alert and oriented to person, place, and time.  Psychiatric:        Mood and Affect: Mood normal.        Behavior: Behavior normal.     Wt Readings from Last 3 Encounters:  02/06/24 209 lb (94.8 kg)  01/17/24 210 lb (95.3 kg)  10/30/23 204 lb 2.3 oz (92.6 kg)    BP 122/74   Pulse 92   Ht 6' (1.829 m)   Wt 209 lb (94.8 kg)   SpO2 93%   BMI 28.35 kg/m   Assessment and Plan:  Problem List Items Addressed This Visit       Unprioritized   Mixed hyperlipidemia (Chronic)   Vascepa  added last visit with no side effects. He continues on Atorvastatin  as well. Will get lipids later this week - ordered      Relevant Orders   Comprehensive metabolic panel with GFR   Lipid panel   Prediabetes (Chronic)   Last A1C 6.5.  he is working on diet changes Will repeat A1C in the near future.      Relevant Orders   Comprehensive metabolic panel with GFR   Hemoglobin A1c   Angio-edema   This is his second episode in 2 months; he also has intermittent hives seemingly triggered by cold weather. Recommend resuming daily Zyrtec; take benadryl if needed Steroid taper - he will go to the ED if any swelling of the tongue/throat or trouble breathing. Refer to Allergy specialist.      Relevant Medications   predniSONE  (DELTASONE ) 10 MG tablet   Other Relevant Orders   Ambulatory referral to Allergy   Other Visit Diagnoses       Acute non intractable tension-type headache    -  Primary   symptoms c/w tension type headache will treat with Advil  and flexeril bid PRN   Relevant Medications   cyclobenzaprine (FLEXERIL) 10 MG tablet       No follow-ups on file.    Leita HILARIO Adie, MD [facility] [place]

## 2024-02-11 ENCOUNTER — Encounter: Payer: Self-pay | Admitting: Internal Medicine

## 2024-02-12 ENCOUNTER — Other Ambulatory Visit: Payer: Self-pay | Admitting: Internal Medicine

## 2024-02-12 DIAGNOSIS — T783XXD Angioneurotic edema, subsequent encounter: Secondary | ICD-10-CM

## 2024-02-12 MED ORDER — PREDNISONE 10 MG PO TABS: ORAL_TABLET | ORAL | 0 refills | Status: AC

## 2024-02-12 NOTE — Progress Notes (Unsigned)
 Date:  02/12/2024   Name:  Hence [facility]   DOB:  04/27/1985   MRN:  [id number]   Chief Complaint: No chief complaint on file.  HPI  Review of Systems   Lab Results  Component Value Date   NA 136 09/16/2023   K 4.2 09/16/2023   CO2 22 09/16/2023   GLUCOSE 203 (H) 09/16/2023   BUN 13 09/16/2023   CREATININE 0.92 09/16/2023   CALCIUM  9.3 09/16/2023   EGFR 117 07/24/2023   GFRNONAA >60 09/16/2023   Lab Results  Component Value Date   CHOL 184 07/24/2023   HDL 27 (L) 07/24/2023   LDLCALC 76 07/24/2023   TRIG 513 (H) 07/24/2023   CHOLHDL 6.8 (H) 07/24/2023   Lab Results  Component Value Date   TSH 0.01 (A) 01/11/2023   Lab Results  Component Value Date   HGBA1C 6.5 (H) 07/24/2023   Lab Results  Component Value Date   WBC 11.1 (H) 09/16/2023   HGB 15.8 09/16/2023   HCT 48.4 09/16/2023   MCV 81.3 09/16/2023   PLT 249 09/16/2023   Lab Results  Component Value Date   ALT 28 09/16/2023   AST 25 09/16/2023   ALKPHOS 103 09/16/2023   BILITOT 0.5 09/16/2023   No results found for: MARIEN BOLLS, VD25OH   Patient Active Problem List   Diagnosis Date Noted   Angio-edema 02/06/2024   COVID 10/30/2023   Prediabetes 04/12/2022   Graves' disease without crisis 04/10/2019   Trigger finger, right ring finger 04/01/2019   Multiple environmental allergies 09/08/2016   Mixed hyperlipidemia 03/01/2016    Allergies[1]  Past Surgical History:  Procedure Laterality Date   NO PAST SURGERIES      Social History[2]   Medication list has been reviewed and updated.  Active Medications[3]     02/06/2024    1:33 PM 07/24/2023    4:03 PM 04/12/2022   10:17 AM 12/12/2021    1:18 PM  GAD 7 : Generalized Anxiety Score  Nervous, Anxious, on Edge 0 0 1 0  Control/stop worrying 0 0 0 0  Worry too much - different things 0 0 1 0  Trouble relaxing 0 0 0 0  Restless 0  0 0  Easily annoyed or irritable 0  1 0  Afraid - awful might happen 0  0 0  Total  GAD 7 Score 0  3 0  Anxiety Difficulty Not difficult at all  Not difficult at all Not difficult at all       02/06/2024    1:33 PM 07/24/2023    4:03 PM 04/12/2022   10:17 AM  Depression screen PHQ 2/9  Decreased Interest 0 0 0  Down, Depressed, Hopeless 0 0 0  PHQ - 2 Score 0 0 0  Altered sleeping 0 0 0  Tired, decreased energy 0 0 0  Change in appetite 0 0 0  Feeling bad or failure about yourself  0 0 0  Trouble concentrating 0 0 0  Moving slowly or fidgety/restless 0 0 0  Suicidal thoughts 0 0 0  PHQ-9 Score 0 0  0   Difficult doing work/chores Not difficult at all Not difficult at all Not difficult at all     Data saved with a previous flowsheet row definition    BP Readings from Last 3 Encounters:  02/06/24 122/74  01/17/24 (!) 149/102  10/30/23 120/77    Physical Exam  Wt Readings from Last 3 Encounters:  02/06/24 209  lb (94.8 kg)  01/17/24 210 lb (95.3 kg)  10/30/23 204 lb 2.3 oz (92.6 kg)    There were no vitals taken for this visit.  Assessment and Plan:  Problem List Items Addressed This Visit   None   No follow-ups on file.    Leita HILARIO Adie, MD [facility] [place]           [1] No Known Allergies [2]  Social History Tobacco Use   Smoking status: Never   Smokeless tobacco: Never  Vaping Use   Vaping status: Never Used  Substance Use Topics   Alcohol use: Yes    Comment: socially   Drug use: No  [3]  No outpatient medications have been marked as taking for the 02/12/24 encounter (Orders Only) with Adie Leita DEL, MD.

## 2024-02-12 NOTE — Telephone Encounter (Signed)
 Please review.  KP

## 2024-02-15 DIAGNOSIS — T783XXD Angioneurotic edema, subsequent encounter: Secondary | ICD-10-CM | POA: Diagnosis not present

## 2024-02-15 DIAGNOSIS — J4520 Mild intermittent asthma, uncomplicated: Secondary | ICD-10-CM | POA: Diagnosis not present

## 2024-02-15 DIAGNOSIS — J3089 Other allergic rhinitis: Secondary | ICD-10-CM | POA: Diagnosis not present

## 2024-02-15 DIAGNOSIS — J301 Allergic rhinitis due to pollen: Secondary | ICD-10-CM | POA: Diagnosis not present

## 2024-02-19 DIAGNOSIS — E782 Mixed hyperlipidemia: Secondary | ICD-10-CM | POA: Diagnosis not present

## 2024-02-19 DIAGNOSIS — R7303 Prediabetes: Secondary | ICD-10-CM | POA: Diagnosis not present

## 2024-02-20 LAB — LIPID PANEL
Chol/HDL Ratio: 5.7 ratio — ABNORMAL HIGH (ref 0.0–5.0)
Cholesterol, Total: 232 mg/dL — ABNORMAL HIGH (ref 100–199)
HDL: 41 mg/dL
LDL Chol Calc (NIH): 159 mg/dL — ABNORMAL HIGH (ref 0–99)
Triglycerides: 177 mg/dL — ABNORMAL HIGH (ref 0–149)
VLDL Cholesterol Cal: 32 mg/dL (ref 5–40)

## 2024-02-20 LAB — COMPREHENSIVE METABOLIC PANEL WITH GFR
ALT: 36 IU/L (ref 0–44)
AST: 21 IU/L (ref 0–40)
Albumin: 4.9 g/dL (ref 4.1–5.1)
Alkaline Phosphatase: 109 IU/L (ref 47–123)
BUN/Creatinine Ratio: 11 (ref 9–20)
BUN: 12 mg/dL (ref 6–24)
Bilirubin Total: 0.4 mg/dL (ref 0.0–1.2)
CO2: 25 mmol/L (ref 20–29)
Calcium: 10 mg/dL (ref 8.7–10.2)
Chloride: 101 mmol/L (ref 96–106)
Creatinine, Ser: 1.08 mg/dL (ref 0.76–1.27)
Globulin, Total: 2.7 g/dL (ref 1.5–4.5)
Glucose: 125 mg/dL — ABNORMAL HIGH (ref 70–99)
Potassium: 4.3 mmol/L (ref 3.5–5.2)
Sodium: 141 mmol/L (ref 134–144)
Total Protein: 7.6 g/dL (ref 6.0–8.5)
eGFR: 89 mL/min/1.73

## 2024-02-20 LAB — HEMOGLOBIN A1C
Est. average glucose Bld gHb Est-mCnc: 148 mg/dL
Hgb A1c MFr Bld: 6.8 % — ABNORMAL HIGH (ref 4.8–5.6)

## 2024-02-23 ENCOUNTER — Ambulatory Visit: Payer: Self-pay | Admitting: Internal Medicine

## 2024-02-23 DIAGNOSIS — E782 Mixed hyperlipidemia: Secondary | ICD-10-CM

## 2024-07-25 ENCOUNTER — Encounter: Admitting: Student
